# Patient Record
Sex: Female | Born: 1935 | Race: White | Hispanic: No | Smoking: Never smoker
Health system: Southern US, Community
[De-identification: ages and names within clinical notes are randomized; demographics above are authoritative.]

## PROBLEM LIST (undated history)

## (undated) DIAGNOSIS — I10 Essential (primary) hypertension: Secondary | ICD-10-CM

## (undated) DIAGNOSIS — F329 Major depressive disorder, single episode, unspecified: Secondary | ICD-10-CM

## (undated) DIAGNOSIS — F419 Anxiety disorder, unspecified: Secondary | ICD-10-CM

## (undated) DIAGNOSIS — F32A Depression, unspecified: Secondary | ICD-10-CM

## (undated) DIAGNOSIS — G309 Alzheimer's disease, unspecified: Secondary | ICD-10-CM

## (undated) DIAGNOSIS — F0280 Dementia in other diseases classified elsewhere without behavioral disturbance: Secondary | ICD-10-CM

## (undated) DIAGNOSIS — K219 Gastro-esophageal reflux disease without esophagitis: Secondary | ICD-10-CM

---

## 2004-07-11 ENCOUNTER — Ambulatory Visit: Payer: Self-pay | Admitting: Internal Medicine

## 2005-07-17 ENCOUNTER — Ambulatory Visit: Payer: Self-pay | Admitting: Internal Medicine

## 2005-12-01 ENCOUNTER — Ambulatory Visit: Payer: Self-pay | Admitting: Internal Medicine

## 2006-05-07 ENCOUNTER — Emergency Department: Payer: Self-pay | Admitting: Unknown Physician Specialty

## 2006-05-07 ENCOUNTER — Other Ambulatory Visit: Payer: Self-pay

## 2006-05-13 ENCOUNTER — Emergency Department: Payer: Self-pay

## 2006-07-26 ENCOUNTER — Ambulatory Visit: Payer: Self-pay | Admitting: Internal Medicine

## 2007-03-07 ENCOUNTER — Encounter: Payer: Self-pay | Admitting: Rheumatology

## 2007-03-10 ENCOUNTER — Encounter: Payer: Self-pay | Admitting: Rheumatology

## 2007-04-07 ENCOUNTER — Encounter: Payer: Self-pay | Admitting: Rheumatology

## 2007-07-29 ENCOUNTER — Ambulatory Visit: Payer: Self-pay | Admitting: Internal Medicine

## 2008-01-23 ENCOUNTER — Emergency Department: Payer: Self-pay | Admitting: Emergency Medicine

## 2008-03-03 ENCOUNTER — Ambulatory Visit: Payer: Self-pay | Admitting: Unknown Physician Specialty

## 2008-07-30 ENCOUNTER — Ambulatory Visit: Payer: Self-pay | Admitting: Internal Medicine

## 2011-09-21 ENCOUNTER — Ambulatory Visit: Payer: Self-pay | Admitting: Family Medicine

## 2011-10-04 ENCOUNTER — Ambulatory Visit: Payer: Self-pay | Admitting: Internal Medicine

## 2011-10-21 ENCOUNTER — Ambulatory Visit: Payer: Self-pay | Admitting: Medical

## 2012-06-06 ENCOUNTER — Ambulatory Visit: Payer: Self-pay | Admitting: Internal Medicine

## 2012-06-09 ENCOUNTER — Observation Stay: Payer: Self-pay | Admitting: Internal Medicine

## 2012-06-09 LAB — TROPONIN I: Troponin-I: <0.02 ng/mL

## 2012-06-09 LAB — COMPREHENSIVE METABOLIC PANEL
Albumin: 3.5 g/dL (ref 3.4–5.0)
Anion Gap: 6 — ABNORMAL LOW (ref 7–16)
BUN: 9 mg/dL (ref 7–18)
Calcium, Total: 9.3 mg/dL (ref 8.5–10.1)
Chloride: 101 mmol/L (ref 98–107)
Co2: 29 mmol/L (ref 21–32)
Creatinine: 0.68 mg/dL (ref 0.60–1.30)
EGFR (African American): >60
Glucose: 96 mg/dL (ref 65–99)
Potassium: 4 mmol/L (ref 3.5–5.1)
SGPT (ALT): 16 U/L (ref 12–78)
Total Protein: 8.1 g/dL (ref 6.4–8.2)

## 2012-06-09 LAB — URINALYSIS, COMPLETE
Bilirubin,UR: NEGATIVE
Blood: NEGATIVE
Glucose,UR: NEGATIVE mg/dL (ref 0–75)
Hyaline Cast: 25
Leukocyte Esterase: NEGATIVE
Nitrite: NEGATIVE
Ph: 6 (ref 4.5–8.0)
Protein: 30
RBC,UR: 2 /HPF (ref 0–5)
Specific Gravity: 1.02 (ref 1.003–1.030)
Squamous Epithelial: 13
WBC UR: 2 /HPF (ref 0–5)

## 2012-06-09 LAB — VALPROIC ACID LEVEL: Valproic Acid: 46 ug/mL — ABNORMAL LOW

## 2012-06-09 LAB — CBC
HCT: 46.6 % (ref 35.0–47.0)
MCV: 91 fL (ref 80–100)
Platelet: 209 10*3/uL (ref 150–440)
RBC: 5.1 10*6/uL (ref 3.80–5.20)

## 2012-06-10 LAB — CBC WITH DIFFERENTIAL/PLATELET
Basophil #: 0 10*3/uL (ref 0.0–0.1)
Eosinophil #: 0.2 10*3/uL (ref 0.0–0.7)
Eosinophil %: 3.5 %
HCT: 42.4 % (ref 35.0–47.0)
HGB: 14.3 g/dL (ref 12.0–16.0)
MCH: 31.1 pg (ref 26.0–34.0)
MCV: 92 fL (ref 80–100)
Monocyte #: 0.5 x10 3/mm (ref 0.2–0.9)
Monocyte %: 7.7 %
Neutrophil #: 4.4 10*3/uL (ref 1.4–6.5)
Neutrophil %: 63 %
Platelet: 195 10*3/uL (ref 150–440)
RBC: 4.59 10*6/uL (ref 3.80–5.20)
RDW: 12.8 % (ref 11.5–14.5)
WBC: 7 10*3/uL (ref 3.6–11.0)

## 2012-06-10 LAB — BASIC METABOLIC PANEL
Anion Gap: 5 — ABNORMAL LOW (ref 7–16)
Calcium, Total: 8.5 mg/dL (ref 8.5–10.1)
Chloride: 103 mmol/L (ref 98–107)
Co2: 28 mmol/L (ref 21–32)
Creatinine: 0.62 mg/dL (ref 0.60–1.30)
EGFR (African American): >60
EGFR (Non-African Amer.): >60
Glucose: 82 mg/dL (ref 65–99)
Osmolality: 270 (ref 275–301)

## 2012-07-07 ENCOUNTER — Ambulatory Visit: Payer: Self-pay | Admitting: Internal Medicine

## 2012-07-13 ENCOUNTER — Emergency Department: Payer: Self-pay | Admitting: Emergency Medicine

## 2012-07-13 LAB — URINALYSIS, COMPLETE
Bacteria: NONE SEEN
Bilirubin,UR: NEGATIVE
Blood: NEGATIVE
Ketone: NEGATIVE
Nitrite: NEGATIVE
Ph: 7 (ref 4.5–8.0)
Specific Gravity: 1.008 (ref 1.003–1.030)
WBC UR: <1 /HPF (ref 0–5)

## 2013-02-03 ENCOUNTER — Emergency Department: Payer: Self-pay | Admitting: Emergency Medicine

## 2013-02-03 LAB — COMPREHENSIVE METABOLIC PANEL
Anion Gap: 5 — ABNORMAL LOW (ref 7–16)
BUN: 15 mg/dL (ref 7–18)
Bilirubin,Total: 0.4 mg/dL (ref 0.2–1.0)
Calcium, Total: 9.1 mg/dL (ref 8.5–10.1)
Co2: 30 mmol/L (ref 21–32)
Creatinine: 0.7 mg/dL (ref 0.60–1.30)
EGFR (African American): >60
Potassium: 3.8 mmol/L (ref 3.5–5.1)
SGOT(AST): 22 U/L (ref 15–37)
SGPT (ALT): 14 U/L (ref 12–78)
Total Protein: 6.9 g/dL (ref 6.4–8.2)

## 2013-02-03 LAB — CBC WITH DIFFERENTIAL/PLATELET
Basophil #: 0 10*3/uL (ref 0.0–0.1)
Eosinophil #: 0.5 10*3/uL (ref 0.0–0.7)
HCT: 41 % (ref 35.0–47.0)
HGB: 13.6 g/dL (ref 12.0–16.0)
Lymphocyte #: 1.8 10*3/uL (ref 1.0–3.6)
Lymphocyte %: 20.8 %
MCHC: 33.1 g/dL (ref 32.0–36.0)
Monocyte #: 0.8 x10 3/mm (ref 0.2–0.9)
Monocyte %: 9.2 %
Neutrophil %: 64.1 %
Platelet: 141 10*3/uL — ABNORMAL LOW (ref 150–440)
RDW: 12.9 % (ref 11.5–14.5)
WBC: 8.9 10*3/uL (ref 3.6–11.0)

## 2013-02-03 LAB — TROPONIN I: Troponin-I: <0.02 ng/mL

## 2013-02-03 LAB — URINALYSIS, COMPLETE
Glucose,UR: NEGATIVE mg/dL (ref 0–75)
Leukocyte Esterase: NEGATIVE
Nitrite: NEGATIVE
Ph: 6 (ref 4.5–8.0)
RBC,UR: 8 /HPF (ref 0–5)
Specific Gravity: 1.018 (ref 1.003–1.030)
Squamous Epithelial: 1
WBC UR: 1 /HPF (ref 0–5)

## 2013-08-18 ENCOUNTER — Emergency Department: Payer: Self-pay | Admitting: Emergency Medicine

## 2013-10-20 ENCOUNTER — Emergency Department: Payer: Self-pay | Admitting: Emergency Medicine

## 2013-10-21 IMAGING — CT CT HEAD WITHOUT CONTRAST
1 series · 16 of 30 positions shown, 20 images · non-contrast
Comparison: none

REASON FOR EXAM: unwitnessed fall; pt found on floor
COMMENTS:

PROCEDURE:     CT  - CT HEAD WITHOUT CONTRAST  - July 13, 2012  [DATE]
RESULT:     Comparison:  None
TECHNIQUE: Multiple axial images from the foramen magnum to the vertex were
obtained without IV contrast.

[Series 2: soft tissue · axial · 0.42mm/px · z∈[+420,+556]mm · 16 of 30 slices shown, 20 images]
[im 2/30  brain]
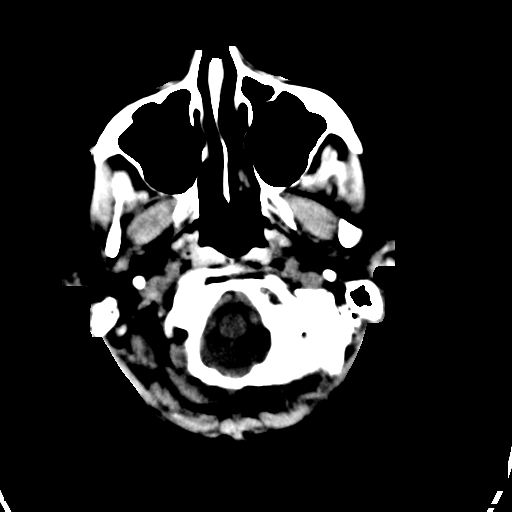
[im 2/30  bone]
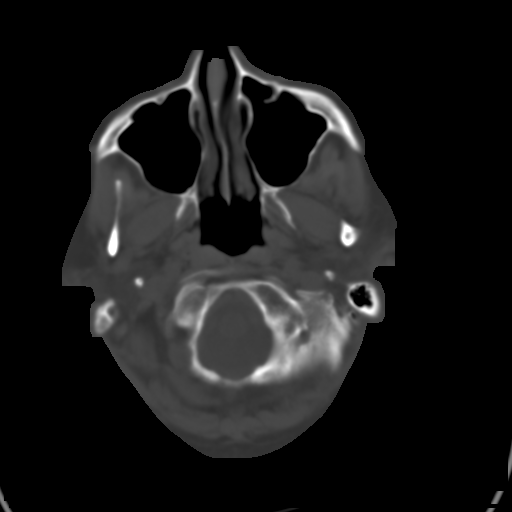
[im 4/30  brain]
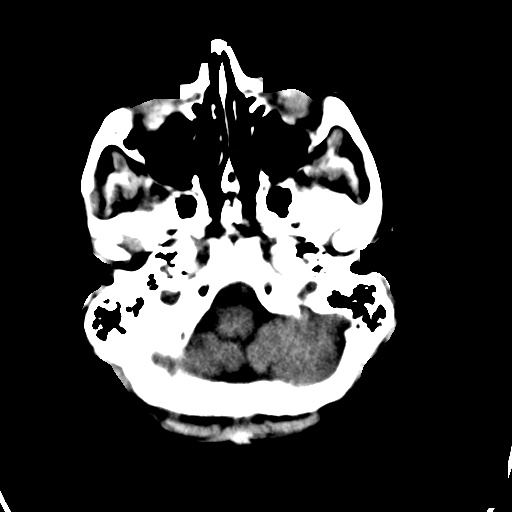
[im 6/30  brain]
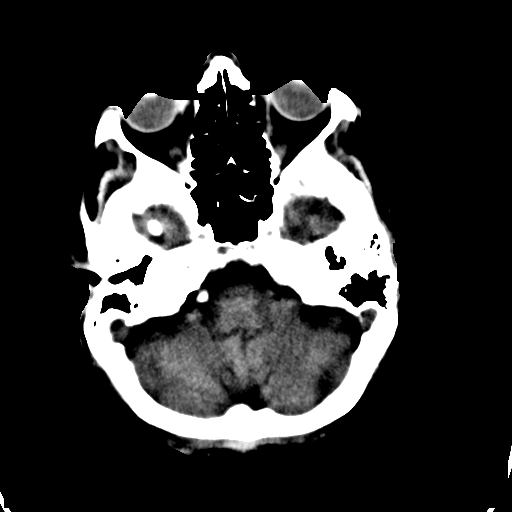
[im 8/30  brain]
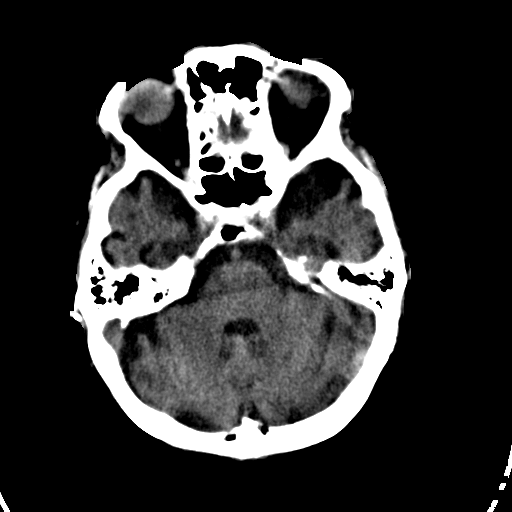
[im 9/30  brain]
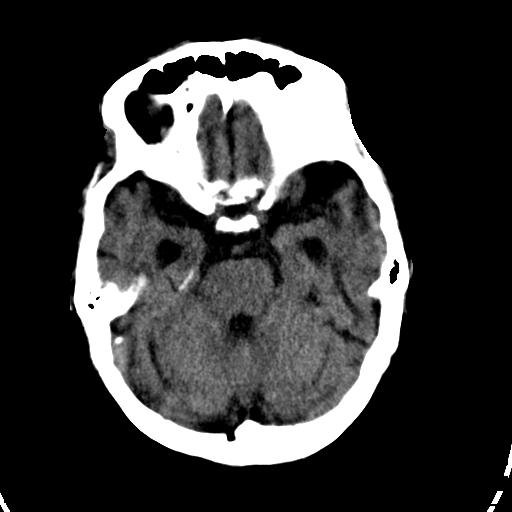
[im 9/30  bone]
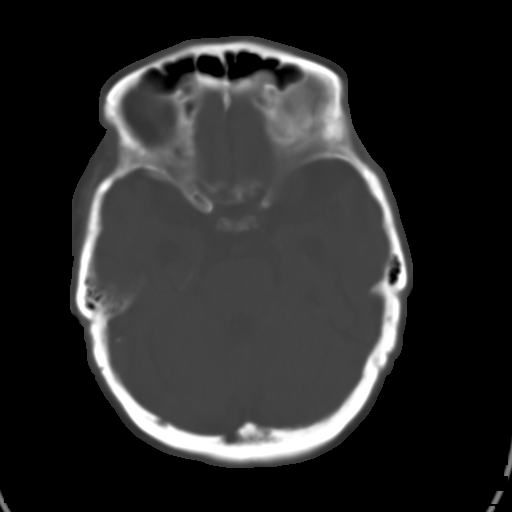
[im 11/30  brain]
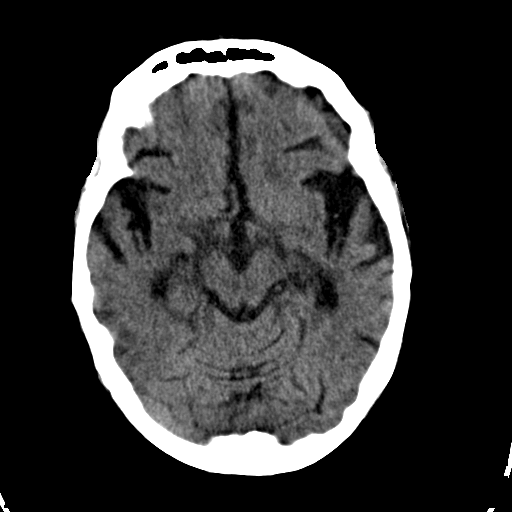
[im 13/30  brain]
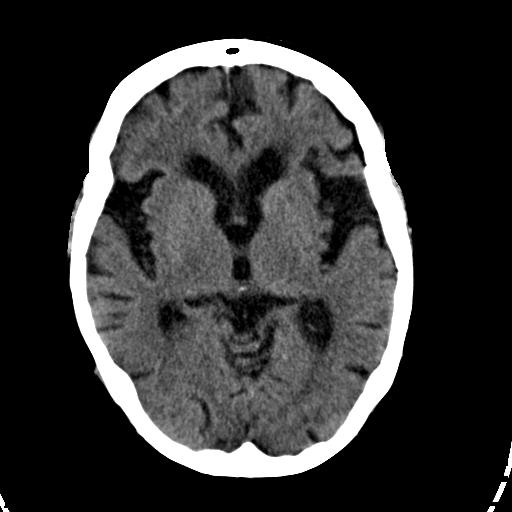
[im 15/30  brain]
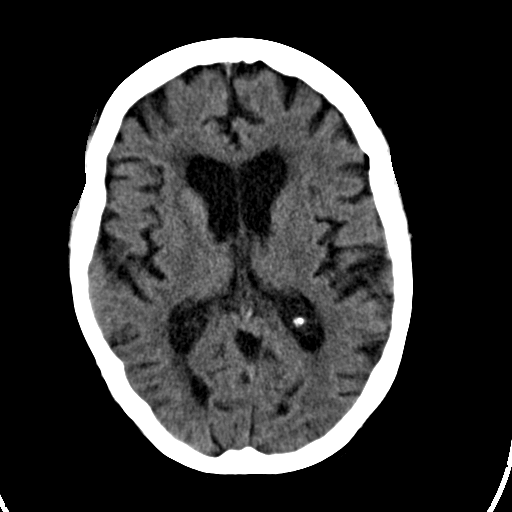
[im 16/30  brain]
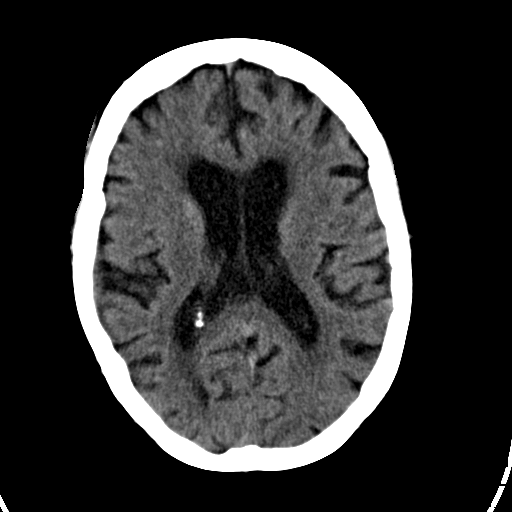
[im 16/30  bone]
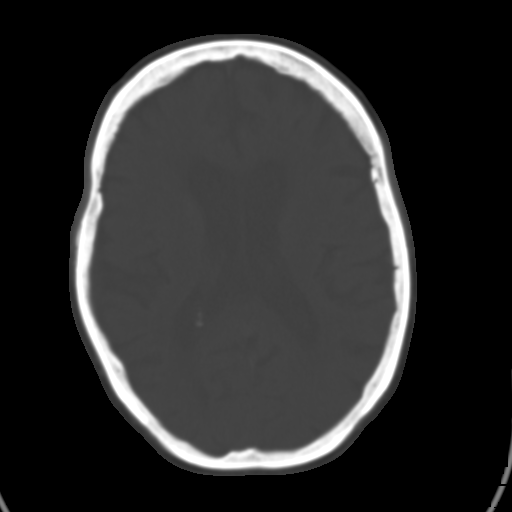
[im 18/30  brain]
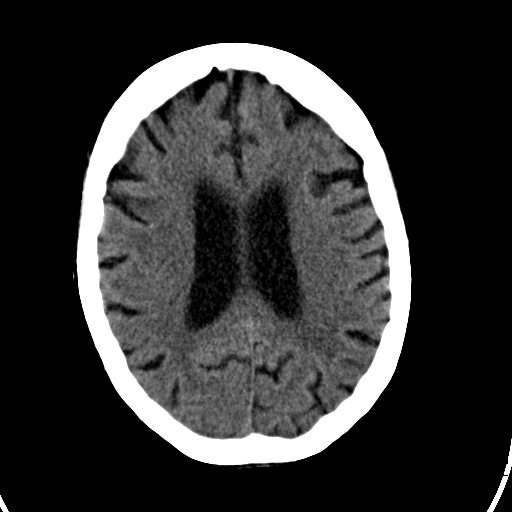
[im 20/30  brain]
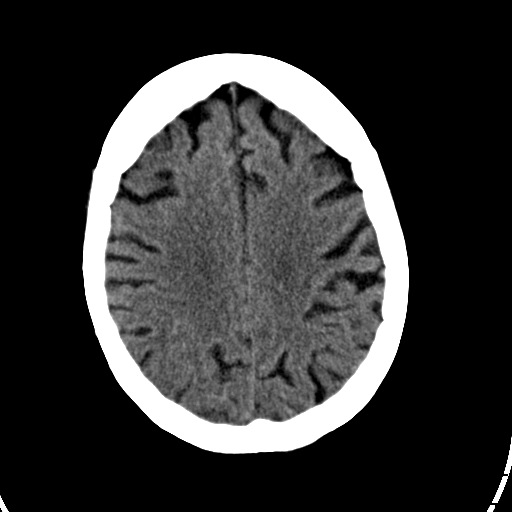
[im 22/30  brain]
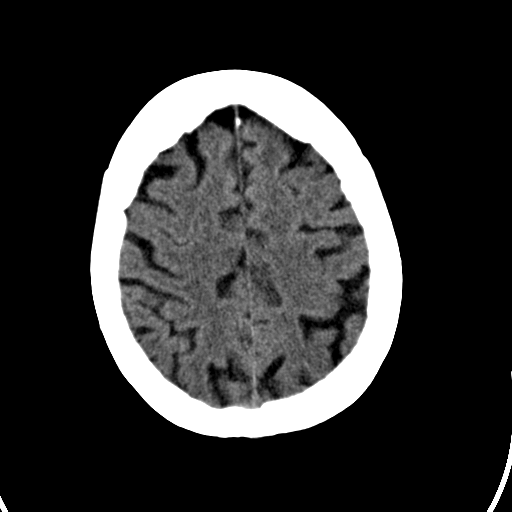
[im 23/30  brain]
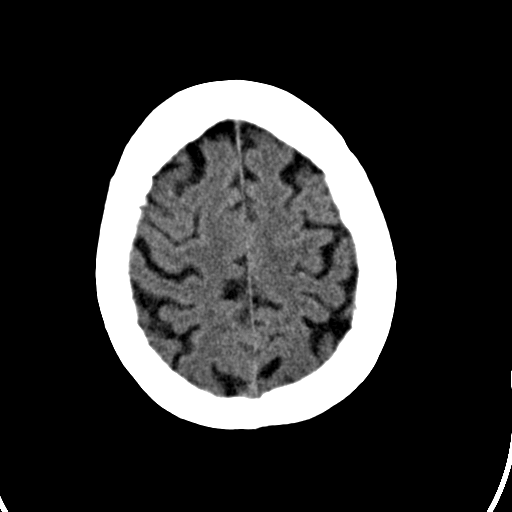
[im 23/30  bone]
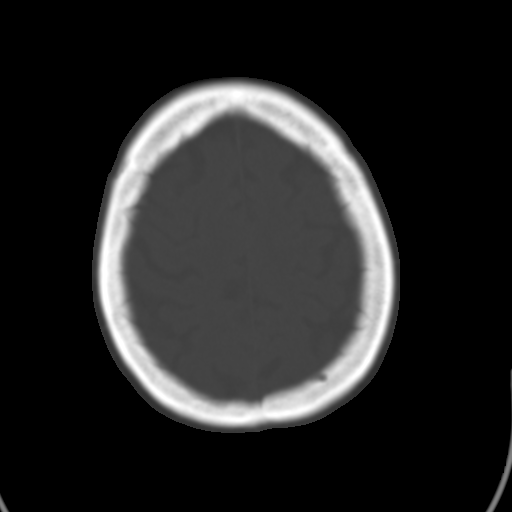
[im 25/30  brain]
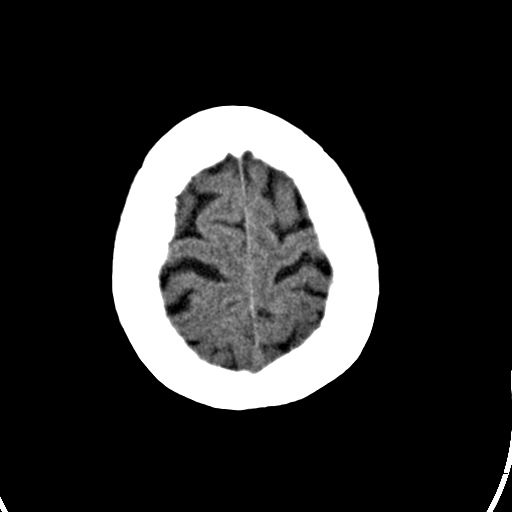
[im 27/30  brain]
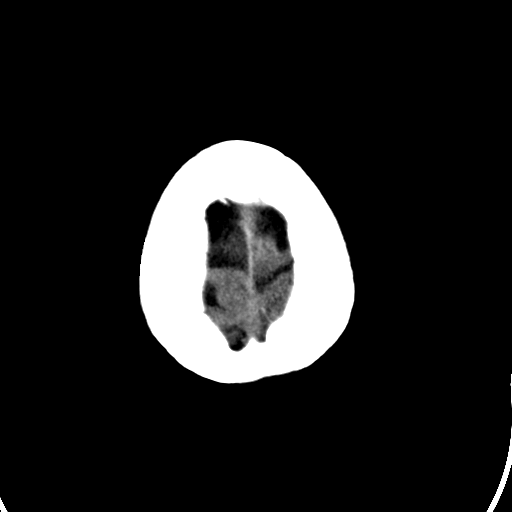
[im 29/30  brain]
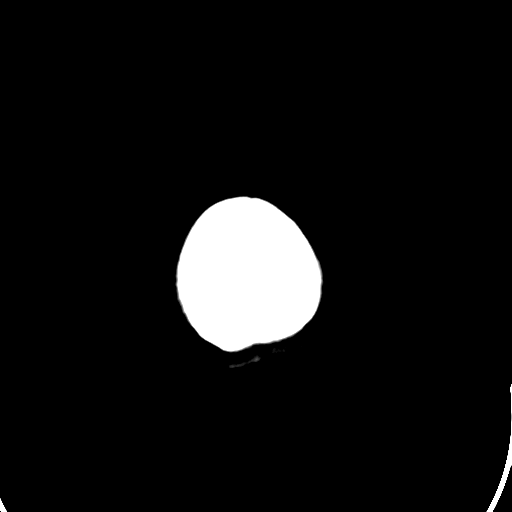

[16 of 30 positions shown; findings below may reference images not displayed]

FINDINGS: There is no evidence of mass effect, midline shift, or extra-axial fluid
collections.  There is no evidence of a space-occupying lesion or
intracranial hemorrhage. There is no evidence of a cortical-based area of
acute infarction. There is generalized cerebral atrophy. There is
periventricular white matter low attenuation likely secondary to
microangiopathy.

The ventricles and sulci are appropriate for the patient's age. The basal
cisterns are patent.

Visualized portions of the orbits are unremarkable. The visualized portions
of the paranasal sinuses and mastoid air cells are unremarkable.

The osseous structures are unremarkable.
IMPRESSION: No acute intracranial process.

[REDACTED]

## 2013-10-21 IMAGING — CT CT CERVICAL SPINE WITHOUT CONTRAST
1 series · 9 of 14 positions shown, 12 images · non-contrast
Comparison: None

REASON FOR EXAM: unwitnessed fall; pt found on floor of [REDACTED]
COMMENTS:

PROCEDURE:     CT  - CT CERVICAL SPINE WO  - July 13, 2012  [DATE]
RESULT:     Clinical Indication: Trauma
TECHNIQUE: Multiple axial CT images from the skull base to the mid vertebral
body of T1. obtained with sagittal and coronal reformatted images provided.

[Series 4: axial · axial · 0.20mm/px · z∈[+264,+413]mm · 9 of 99 slices shown, 12 images]
[im 8/99  soft-tissue]
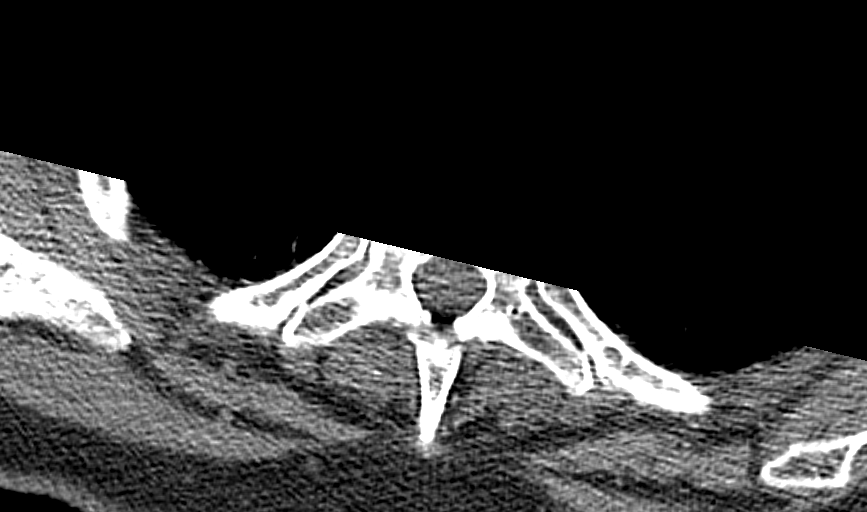
[im 8/99  bone]
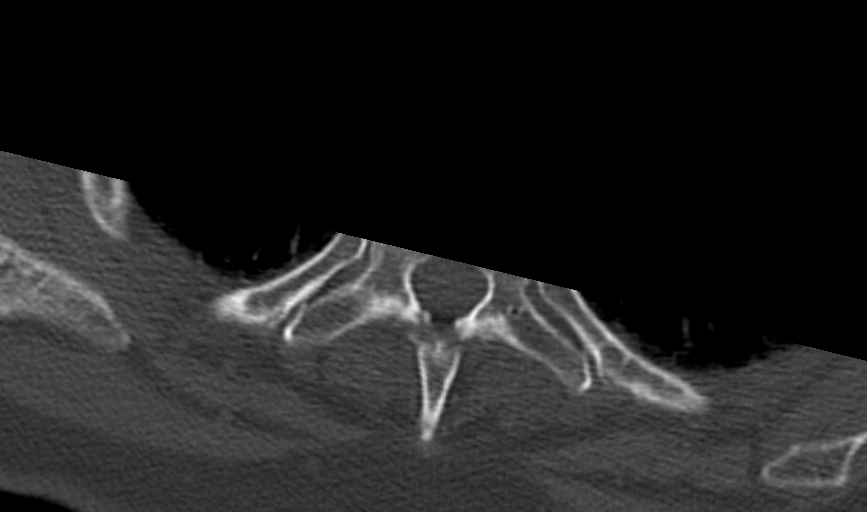
[im 23/99  bone]
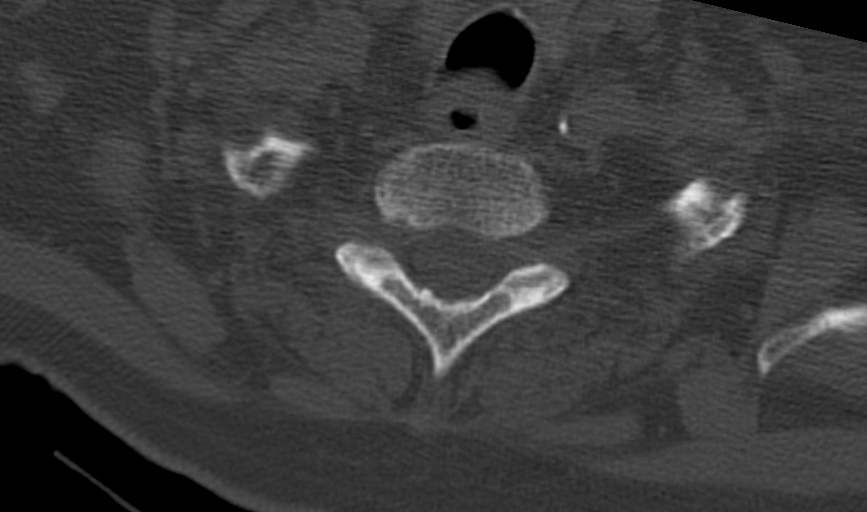
[im 31/99  bone]
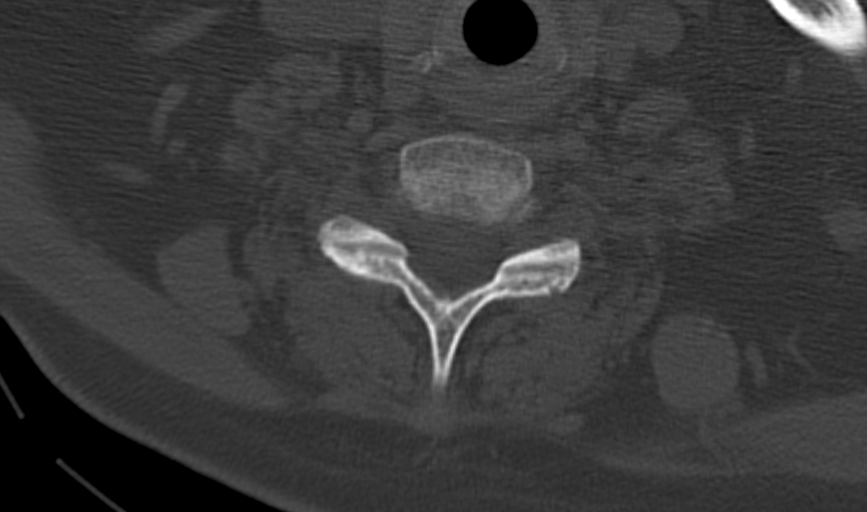
[im 38/99  bone]
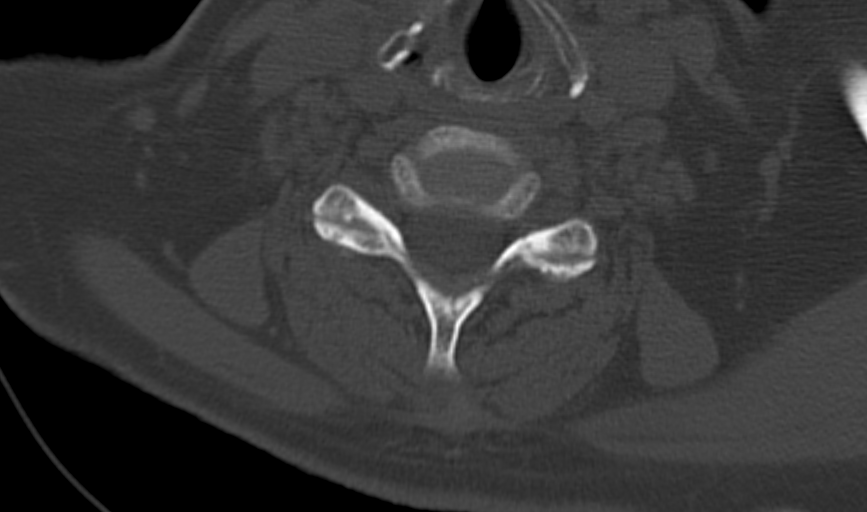
[im 53/99  soft-tissue]
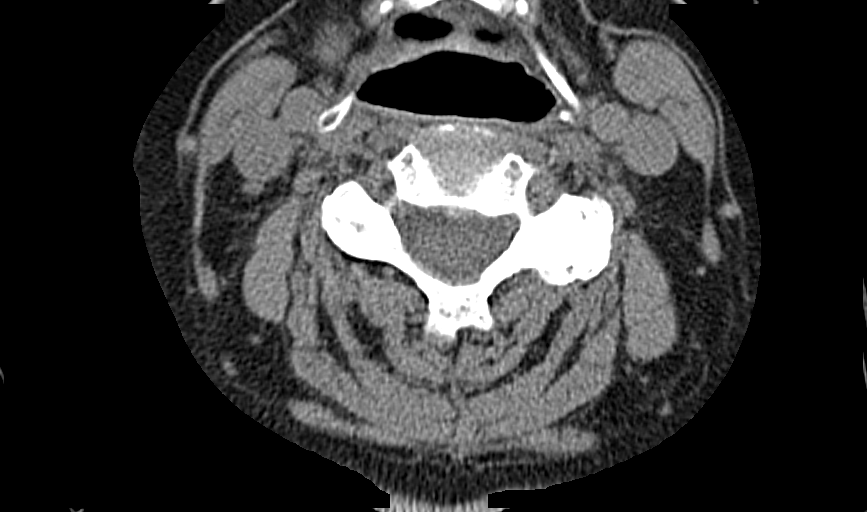
[im 53/99  bone]
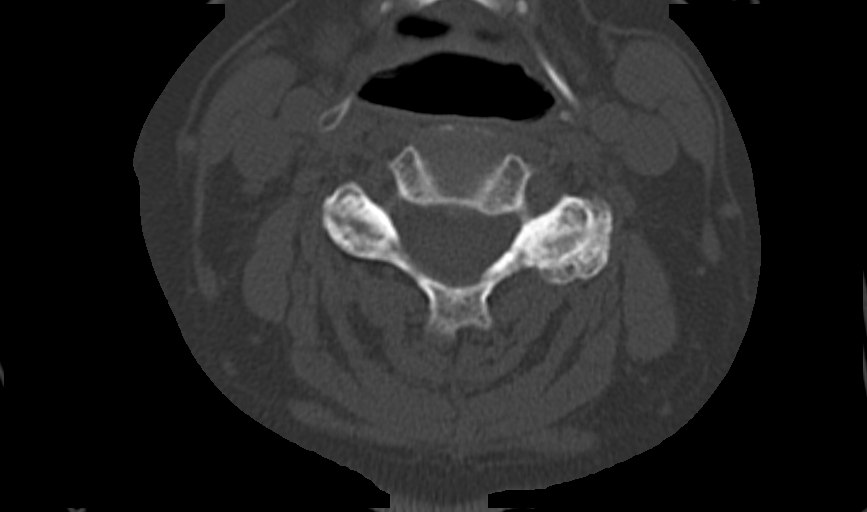
[im 61/99  bone]
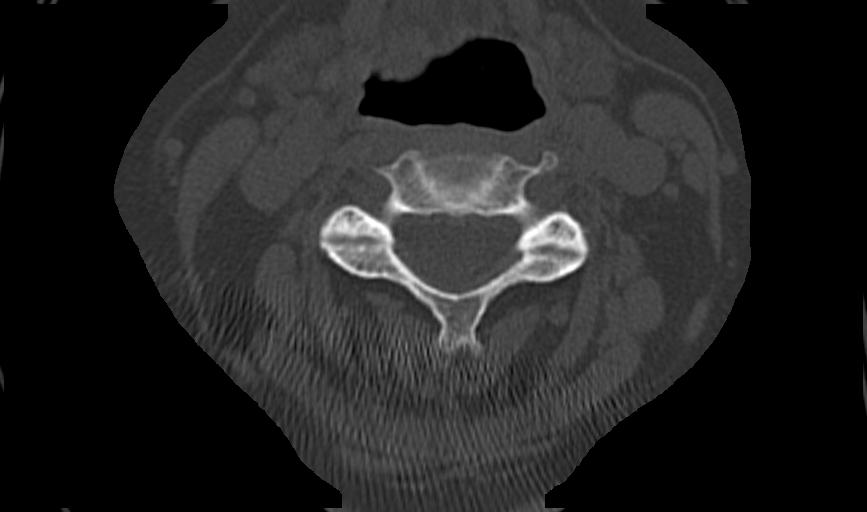
[im 68/99  bone]
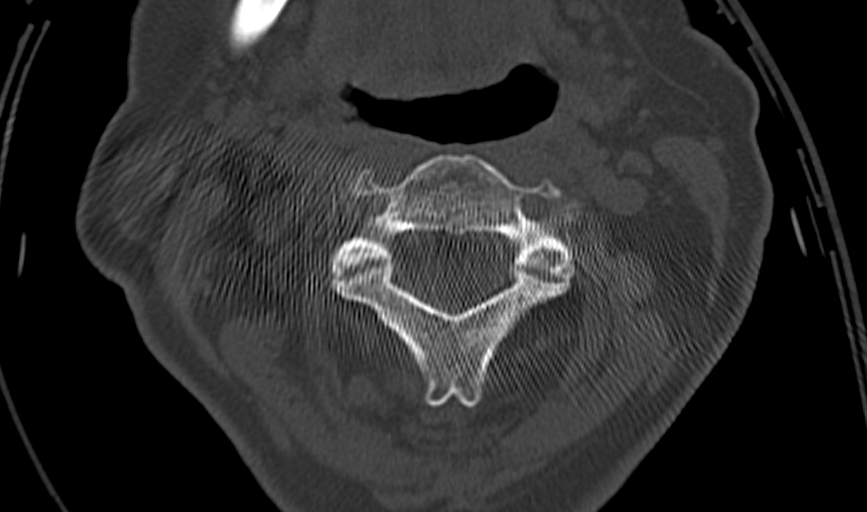
[im 83/99  bone]
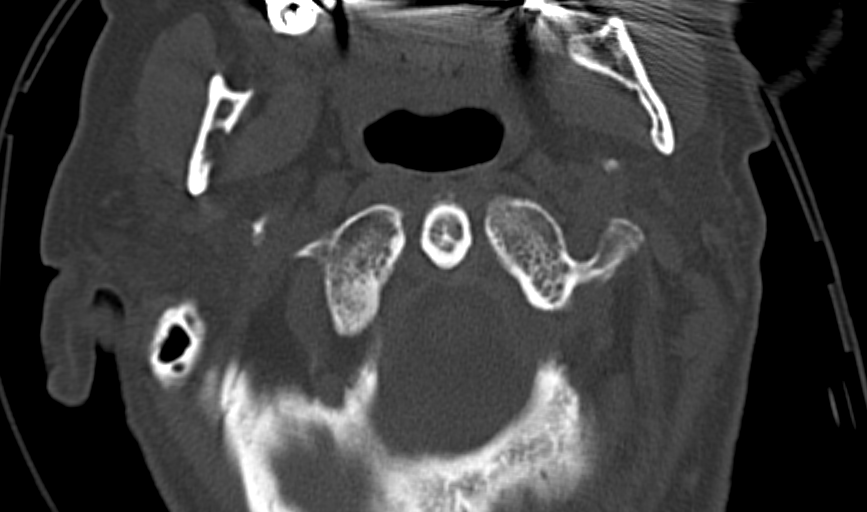
[im 91/99  soft-tissue]
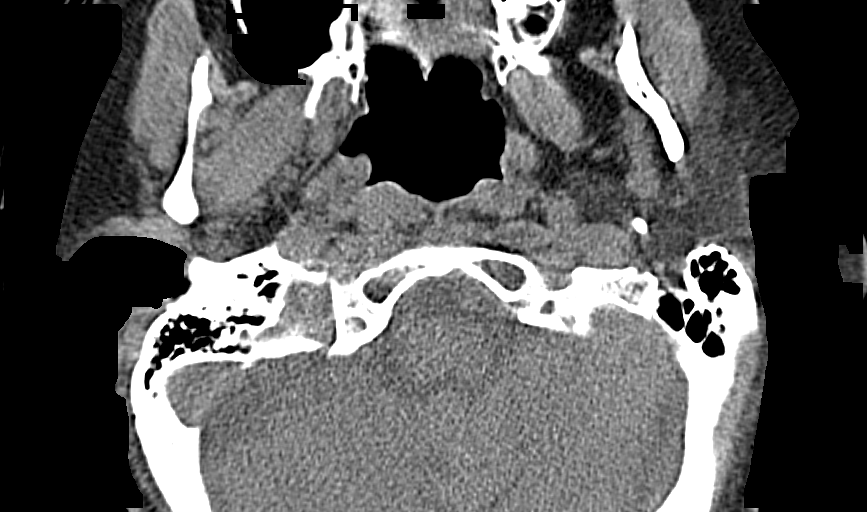
[im 91/99  bone]
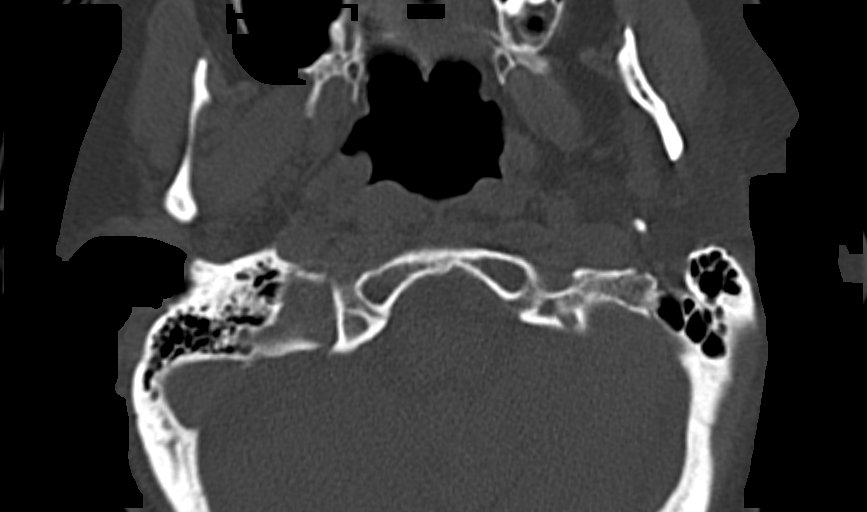

[9 of 14 positions shown; findings below may reference images not displayed]

FINDINGS: The alignment is anatomic. The vertebral body heights are maintained. There
is no acute fracture or static listhesis. The prevertebral soft tissues are
normal. The intraspinal soft tissues are not fully imaged on this
examination due to poor soft tissue contrast, but there is no soft tissue
gross abnormality.

There is diffuse left cervical facet arthropathy. The disc spaces are
maintained.

The visualized portions of the lung apices demonstrate no focal abnormality.
IMPRESSION: 1. No acute osseous injury of the cervical spine.

2. Ligamentous injury is not evaluated. If there is high clinical concern
for ligamentous injury, consider MRI or flexion/extension radiographs as
clinically indicated and tolerated.

[REDACTED]

## 2014-05-08 ENCOUNTER — Ambulatory Visit: Payer: Self-pay

## 2014-05-29 NOTE — Discharge Summary (Signed)
PATIENT NAME:  Angie Larsen, Angie Larsen MR#:  [id number] DATE OF BIRTH:  02-12-1933  DATE OF ADMISSION:  06/09/2012 DATE OF DISCHARGE:  06/12/2012  ADMITTING DIAGNOSIS: Altered mental status.   DISCHARGE DIAGNOSES: 1.  Altered mental status, questionable and suspected delirium due to infection.  2.  Suspected bacterial versus aspiration pneumonia.  3.  Dysphagia.  4.  Parkinsonism syndrome.  5.  Dementia.  6.  History of hypertension, hyperlipidemia, depression and gastroesophageal reflux disease, status post skin cancer removal as well as hysterectomy.   DISCHARGE CONDITION: Guarded.   DISCHARGE MEDICATIONS: The patient is to resume her outpatient medications which are:   1.  Divalproex sodium 250 mg p.o. twice daily.  2.  Lorazepam 0.5 mg p.o. twice daily.  3.  Ranitidine 150 mg p.o. twice daily.  4.  Paroxetine 40 mg p.o. daily.  5.  Crestor 10 mg once daily.  6.  Metoprolol tartrate 100 mg p.o. once daily at bedtime.  7.  Haloperidol 0.5 mg twice daily as needed.  8.  Docusate sodium 100 mg p.o. once daily.  9.  Levofloxacin 250 mg p.o. once daily for 4 more days.  10.  Calcium with vitamin D, 500/200, 1 tablet twice a day.   HOME OXYGEN: None.   Two-gram salt, low-fat, low-cholesterol, mechanical soft with thin liquids and ground meats, with gravy added to moisten. Strict aspiration precautions. Medications should be given in puree or crushed.   Patient should have meats ground with gravy added, send all foods in small cups or bowls for patient to hold and self-feed. Assist with feeding support if patient is unable to feed herself due to tremors.   Yogurt at breakfast, and ice cream.   ACTIVITY LIMITATIONS: As tolerated.   Physical therapy, occupational therapy speech therapy 2 to 7 times a week.   Followup appointment with Dr. Quillian Quince in 2 days after discharge.   CONSULTANTS: Pain management: Dr. Malvin Johns, neurology.  RADIOLOGIC STUDIES: Chest, portable single-view, 4th of May  2014 revealed a density at the left lung base consistent with atelectasis and pneumonia. Followup here in [place] would be useful if the patient can tolerate this procedure.   CT scan of head without contrast, 4th of May 2014 showed no acute abnormality. No acute bony abnormality as well.  Chest x-ray, PA and lateral, 6th of May 2014 showed mild left basilar opacity similar to recent prior. This could be secondary to atelectasis or infection. PA and lateral radiographs were recommended to ensure resolution.   HOSPITAL COURSE: The patient is a 81 year old Caucasian female with a history of dementia as well as hypertension, hyperlipidemia who presented to the hospital as the patient's family was not able to wake her up. Apparently over the past few days prior to coming to the hospital the patient was more somnolent and unable to recognize the family, and then the patient's husband felt that he was not able to take care of her at home due to medical issues. The patient was not eating, and she fell and he was not able to get her up, and so he brought her to the hospital. On arrival to the hospital to the Emergency Room the patient's vitals: Her temperature was 96.7, pulse was 76, respiratory rate was 18, blood pressure 129/66, saturation was 93% on room air.   Physical exam was unremarkable except the patient did have a 3/6 systolic ejection murmur and trace edema in the lower extremities. Otherwise, no abnormalities were found. On lung exam she was  not taking deep breaths, so it was difficult to examine overall.    The patient's lab data on the day of admission, 4th of May 2014: Normal BMP, normal liver enzyme test. Troponin was less than 0.02. TSH was normal at 3.37. Valproic acid level was somewhat low at 46. Patient's white blood cell count was normal at 8.2, hemoglobin was 16.0, platelet count 209.   Urinalysis revealed yellow, hazy, negative for glucose, bilirubin; 1+ ketones were noted. Specific  gravity was 1.020, PH was 6.0, negative for blood, 30 mg/dL protein, negative for nitrites or leukocyte esterase, 2 red blood cells, 2 white blood cells, no bacteria. Fifteen epithelial cells were noted as well. Mucus was present, and 25 hyaline casts.   EKG showed normal sinus rhythm at 64 beats per minute, inferior infarct, age indeterminate, as well as possible anterior infarct, age indeterminate. As compared to prior EKG done in September 2013 the patient was noted to have some changes of anterior infarct which are new.   The patient was admitted to the hospital for further evaluation as her chest x-ray was concerning for pneumonia. She was initiated on IV fluids as well as antibiotics. With IV fluid support as well as antibiotic therapy, the patient improved. She was more alert and awake, while her communication was limited due to her severe dementia. Consultation with Dr. Malvin Johns was obtained.   In regards to parkinsonian symptoms he felt that patient has advanced dementia. He also felt that the patient did not have any waxing or waning mental status changes, though he felt that a longer period of time of observation may be necessary to be certain about that. He was not aware about  her baseline, so he was not sure how far away this was from her baseline. On exam she had numerous signs of parkinsonism which can be seen in patients with advanced stages of dementia. It could be also related to primary Parkinson's disease with later onset of memory loss. As the patient was unable to provide historical distinctions due to memory loss Dr. Malvin Johns recommended to check the patient's hematocrits, B12, as well as TSH, however, did not recommend any additional neurologic workup or testing. He felt that there was no need for EMG studies. If from the history, according to Dr. Malvin Johns it appears that the patient's parkinsonism symptoms started before the memory loss, then a trial of Sinemet 25/100 3 times daily could be  initiated, according to him, for 2 days to see if the patient would improve with motor function. However he stated that we should not expect improvement of her memory. Overall he felt that the patient's presentation was more consistent with advanced progressive memory loss such as Alzheimer's or vascular dementia. He recommended palliative care, hospice consultation as well. Palliative care was consulted, and the patient was seen by Mr. Laurette Schimke, nurse practitioner, who discussed the patient's care with family. Palliative care felt that the patient does not qualify for hospice at this time as FAST was not 7c before she qualifies due to dementia criteria. He discussed with family, and the patient's family decided about patient being DO NOT RESUSCITATE. Patient was also seen by Dr. Harvie Junior, who discussed the patient's case with Mr. Windy Fast. She agreed to the assessment and plan and recommended placement.   Social workers were involved for placement matters, as well as physical therapy. The patient was deemed to be appropriate for skilled nursing facility placement, and she is going to be discharged today on the 7th  of May 2014.   On the day of discharge the patient's vitals: Temperature 97.5, pulse 66, respiratory rate was  19, blood pressure 135/62, saturation was 96% on room air at rest.   Time spent: Forty minutes.   ____________________________ Katharina Caperima Burleigh Brockmann, MD rv:dm D: 06/12/2012 11:47:17 ET T: 06/12/2012 12:25:52 ET JOB#: [id number]  cc: Katharina Caperima Margarethe Virgen, MD, <Dictator> Burley SaverL. Katherine Bliss, MD Crystal Scarberry MD ELECTRONICALLY SIGNED 06/25/2012 7:12

## 2014-05-29 NOTE — Consult Note (Signed)
Brief Consult Note: Diagnosis: Encephalopathy.   Patient was seen by consultant.   Discussed with Attending MD.   Comments: Patient is noted from have a moderate to severe dementia.  She is only oriented to person.  No waxing and waning to her mental status during my exam, though longer time period of observation would be needed to be certain about this.  I am not aware of her baseline so I am unsure how far she is from her baseline.  On exam she has numerous signs of parkinsonism which could be seen in patients with advanced stages of dementia or could be related to primary parkinsons disease with later onset of memory loss.  Patient is unable to provide these historical distinctions due to her memory loss.  Beyond HCT, B12 and TSH, I would not recommend additional neurologic workup or testing.  No need for EMG.  Her issue at present doe not appear to have an episodic component to suggest epilepsy/seizures.  If the history shows that parkinsonism started before the memory loss, then a trial of sinemet 25/100 tid could be initiated for 2 days to see if this improves her motor function.  However, this would not be expected to improve her memory.  Would rec continued medical surveillance to try to identify any other medical causes for her presumably worsened mental status.  At present, her states appears most consistent with advanced progressive memory loss as is most often associated with Alzheimer's vs vascular dementia.  Electronic Signatures: Morene CrockerPotter, Enedelia Martorelli E (MD)  (Signed 06-May-14 14:09)  Authored: Brief Consult Note   Last Updated: 06-May-14 14:09 by Morene CrockerPotter, Janit Cutter E (MD)

## 2014-05-29 NOTE — H&P (Signed)
PATIENT NAME:  Angie Larsen, Angie Larsen MR#:  [id number] DATE OF BIRTH:  1933-05-30  DATE OF ADMISSION:  06/09/2012  PRIMARY CARE PHYSICIAN: Burley Saver, MD   CHIEF COMPLAINT: Brought in by family because they could not wake her up this morning.   HISTORY OF PRESENT ILLNESS: This is a 81 year old female with dementia. She is not the best historian. The family could not get her awake today.  Now she has woken up and does not recognize her family. Most of the time she does recognize family.   Her husband is unable to take care of her at home secondary to his medical issues. She did not eat food last night, and she had 2 falls yesterday. The patient is unable to provide any history or review of systems. History was obtained by the family at the bedside.   PAST MEDICAL HISTORY: Dementia, hypertension, hyperlipidemia, depression, gastroesophageal reflux disease.   PAST SURGICAL HISTORY: Skin cancer and hysterectomy.   ALLERGIES: No known drug allergies.   MEDICATIONS: Include calcium and vitamin D 1 tablet daily, Crestor 20 mg 1/2 tablet daily, Depakote 250 mg twice a day, Colace 100 mg daily, Haldol 0.5 mg twice a day as needed for agitation, lorazepam 0.5 mg twice a day, metoprolol tartrate 100 mg at bedtime, Paxil 40 mg daily, ranitidine 150 mg twice a day.   SOCIAL HISTORY: She lives with husband. No smoking. No alcohol. No drug use. She used to work as a Tree surgeon.   FAMILY HISTORY: Father had heart trouble and CVA. Mother had congestive heart failure.   REVIEW OF SYSTEMS: Unable to obtain secondary to dementia.   PHYSICAL EXAMINATION: VITAL SIGNS: Temperature 96.7, pulse 66, respirations 18, blood pressure 129/66, pulse oximetry 93% on room air.  GENERAL: No respiratory distress.  HEENT: Eyes: Conjunctivae and lids normal. Pupils are equal, round, and reactive to light. Extraocular muscles are intact. No nystagmus. Ears, nose, mouth, and throat: Tympanic membranes no erythema. Nasal mucosa  no erythema. Throat no erythema. No exudate seen. Lips and gums no lesions.  NECK: No JVD. No bruits. No lymphadenopathy. No thyromegaly. No thyroid nodules palpated.  RESPIRATORY: The patient is not taking deep breaths for me.  What I did hear was clear. No rhonchi, rales or wheeze heard.  CARDIOVASCULAR: S1, S2 normal. +3 out of 6 systolic ejection murmur. Carotid upstroke 2+ bilaterally. No bruits. Dorsalis pedis pulses 2+ bilaterally. Trace edema of the lower extremity.  ABDOMEN: Soft, nontender. No organomegaly or splenomegaly. Normoactive bowel sounds. No masses felt.  LYMPHATIC: No lymph nodes in the neck.  MUSCULOSKELETAL: No clubbing. Trace edema. No cyanosis.  SKIN: No rashes or ulcers seen.  MUSCULOSKELETAL:  On bilateral knees, slight erythema, slight warmth.  NEUROLOGICAL: Difficult to test secondary to dementia.  The patient does move extremities on her own but not to command.  PSYCHIATRIC: The patient is alert, and I cannot test orientation secondary to dementia.   LABORATORY AND RADIOLOGICAL DATA:  Chest x-ray:  Left lung base consistent with atelectasis or pneumonia.  Troponin negative. Glucose 96, BUN 9, creatinine 0.68, sodium 136, potassium 4.0, chloride 101, CO2 29, calcium 9.3. Liver function tests normal range. White blood cell count 8.2, hemoglobin and hematocrit 16.0 and hematocrit is 46.6, platelet count of 209. Urinalysis 1+ ketones, 30 mg/dL protein, otherwise negative.   ASSESSMENT AND PLAN: 1.  Encephalopathy:  They could not wake her up this morning. Now she is more awake and unable to recognize family. This could be worsening dementia. She  has had dementia for a long-term.  Could be infection-related. Chest x-ray shows atelectasis versus pneumonia. Bilateral erythema on her knees could be a cellulitis, will give empiric Levaquin and see if she improves.  The patient does not have a cough. No fever. No white count. Unclear if this is an infection. Will check a Depakote  level.  2.  Dementia: The patient is on psychiatric medications including Haldol, Depakote and p.r.n. lorazepam.  3.  Hypertension:  Continue metoprolol.  4.  Hyperlipidemia: On Crestor.  5.  Depression: On Paxil.  6.  Gastroesophageal reflux disease: On ranitidine.   CODE STATUS: The patient is a DNR.   TIME SPENT ON ADMISSION: 50 minutes.  ____________________________ Herschell Dimesichard J. Renae GlossWieting, MD rjw:[id number] D: 06/09/2012 19:53:11 ET T: 06/09/2012 20:03:47 ET JOB#: [id number]  cc: Herschell Dimesichard J. Renae GlossWieting, MD, <Dictator> Burley SaverL. Katherine Bliss, MD Salley ScarletICHARD J Valene Villa MD ELECTRONICALLY SIGNED 06/10/2012 21:33

## 2014-05-29 NOTE — Consult Note (Signed)
Referring Physician:  Loletha Grayer :   Primary Care Physician:  Reyes Ivan [address] [place], [place] [address], [phone]  Reason for Consult: Admit Date: 09-Jun-2012  Chief Complaint: somnolence  Reason for Consult: parkinsonism   History of Present Illness: History of Present Illness:   HISTORY OF PRESENT ILLNESS: year old woman with a history of advanced moderate to advanced dementia presents after patient appeared excessively somnolent.  Neurology is asked to evaluate regarding symptoms of parkinsonism.  Patient with increasing falls recently.  Lives at home with family.  At baseline she is apparently able to recognize some family members although on day of admission she could not do this which was a change.  Not on any medication for memory loss.  Patient unable to provide much history due to her memory loss, history is gathered from prior notes and nursing staff.  No family at bedside this evening.   MEDICAL HISTORY: hypertension, hyperlipidemia, depression, gastroesophageal reflux disease.  SURGICAL HISTORY: cancer and hysterectomy.  HISTORY: with unspecified heart problems and stroke.  Mother with CHF.  HISTORY: with her husband. No tobacco, alcohol or drugs.  Previously worked as a Insurance claims handler.   MEDICATIONS: Calcium and vitamin D 1 tablet daily, Crestor 20 mg 1/2 tablet daily, Depakote 250 mg twice a day, Colace 100 mg daily, Haldol 0.5 mg twice a day as needed for agitation, lorazepam 0.5 mg twice a day, metoprolol tartrate 100 mg at bedtime, Paxil 40 mg daily, ranitidine 150 mg twice a day.  No known drug allergies.   EXAM   NAD.  Normocephalic and atraumatic. exam shows normal disc size, appearance and C/D ratio without clear evidence of papilledema. and S2 sounds are within normal limits, without murmurs, gallops, or rubs.  Resting tremor noted in upper extremities, better with movement. Bulk - Normal- Increased in the upper extremities.Drift - Absent bilaterally.-  Deferred due to patient on fall precautions.    Shoulder abduction (deltoid/supraspinatus, axillary/suprascapular n, C5)   Elbow flexion (biceps brachii, musculoskeletal n, C5-6)   Elbow extension (triceps, radial n, C7)   Finger adduction (interossei, ulnar n, T1)    Hip flexion (iliopsoas, L1/L2)   Knee flexion (hamstrings, sciatic n, L5/S1)   Knee extension (quadriceps, femoral n, L3/4)   Ankle dorsiflexion (tibialis anterior, deep fibular n, L4/5)   Ankle plantarflexion (gastroc, tibial n, S1) STATUS:is oriented x 1.  Recent memory is severely diminished.  Remote memory is moderately decreased.  Attention span and concentration are moderate diminished.  Naming, repetition, comprehension and expressive speech are within normal limits.   NERVES:   CN II (normal visual acuity and visual fields)   CN III, IV, VI (extraocular muscles are intact)   CN V (facial sensation is intact bilaterally)   CN VII (facial strength is intact bilaterally)   CN VIII (hearing is intact bilaterally)   CN IX/X (palate elevates midline, normal phonation)   CN XI (shoulder shrug strength is normal and symmetric)   CN XII (tongue protrudes midline) to pain and temp bilaterally (spinothalamic tracts)to position and vibration bilaterally (dorsal columns)    Biceps   Brachioradialis    Patellar   Achilles  Unable to follow command due to dementia.  ASSESSMENT/RECS 81 year old woman with a history of advanced moderate to advanced dementia presents after patient appeared excessively somnolent.  Patient is noted from have a moderate to severe dementia.  She is only oriented to person.  No waxing and waning to her mental status during  my exam, though longer time period of observation would be needed to be certain about this.  I am not aware of her baseline so I am unsure how far she is from her baseline.  On exam she has numerous signs of parkinsonism which could be seen in patients with advanced stages of dementia or could be  related to primary parkinsons disease with later onset of memory loss.  Patient is unable to provide these historical distinctions due to her memory loss.  Beyond HCT, B12 and TSH, I would not recommend additional neurologic workup or testing.  HCT reviewed and is unremarkable.  No need for EMG.  Her issue at present does not appear to have an episodic component to suggest epilepsy/seizures.  If the history shows that parkinsonism started before the memory loss, then a trial of sinemet 25/100 tid could be initiated for 2 days to see if this improves her motor function.  However, this would not be expected to improve her memory.  Would rec continued medical surveillance to try to identify any other medical causes for her presumably worsened mental status.  Of note patient has been diagnosed with a possible pneumonia and started on antibiotics.  Given that her mental status has improved greatly over her hospitalization this seems most consistent with medical delirium on top of underlying advanced dementia.  Patients with advanced dementia have limited reserve capacity and in the setting of infection can present with what appears to be marked neurologic deterioration which seems to be the case here.  The rapid improvement in her previously somnolent state in the setting of possible pneumonia seems to support this etiology for her presentation.  At present, her states appears most consistent with advanced progressive memory loss as is most often associated with Alzheimer's vs vascular dementia.  Consider community living arrangement as this may be safer for her and provide more support for her unique needs.  Consider palliative care/hospice consultation if only for family education about end of life care and support.   Melrose Nakayama, MD     ROS:  General denies complaints   HEENT no complaints   Lungs no complaints   Cardiac no complaints   GI no complaints   GU no complaints   Musculoskeletal no complaints    Extremities no complaints   Skin no complaints   Endocrine no complaints   Past Medical/Surgical Hx:  Dementia:   Sleeping trouble, Depression:   High Cholesterol:   HTN:   Home Medications: Medication Instructions Last Modified Date/Time  divalproex sodium 250 mg oral delayed release tablet 1 tab(s) orally 2 times a day 04-May-14 17:53  LORazepam 0.5 mg oral tablet 1 tab(s) orally 2 times a day 04-May-14 17:53  ranitidine 150 mg oral tablet 1 tab(s) orally 2 times a day 04-May-14 17:53  PARoxetine 40 mg oral tablet 1 tab(s) orally once a day 04-May-14 17:53  Crestor 20 mg oral tablet 0.5 tab (77m) orally once a day. 04-May-14 17:53  Metoprolol Tartrate 100 mg oral tablet 1 tab(s) orally once a day (at bedtime) 04-May-14 17:53  haloperidol 0.5 mg oral tablet 1 tab(s) orally 2 times a day as needed  for agitation. 04-May-14 17:53  docusate sodium sodium 100 mg oral capsule 1 cap(s) orally once a day 04-May-14 17:53  Calcium 600+D 1 tab(s) orally once a day 04-May-14 17:53   [facility] Current Meds:  Sodium Chloride 0.9%, 1000 ml at 50 ml/hr  Acetaminophen * tablet, ( Tylenol (325 mg) tablet)  650 mg  Oral q4h PRN for pain or temp. greater than 100.4  - Indication: Pain/Fever  HePARin injection, 5000 unit(s), Subcutaneous, q12h  Indication: Anticoagulant, Monitor Anticoags per hospital protocol  Ondansetron injection, ( Zofran injection )  4 mg, IV push, q4h PRN for Nausea/Vomiting  Indication: Nausea/ Vomiting  Divalproex Sodium EC tablet, ( Depakote)  250 mg Oral bid/wm  - Indication: Epilepsy/ Mania/ Migraine  Instructions:  DO NOT CRUSH  Haloperidol tablet, ( Haldol)  0.5 mg Oral bid PRN for agitation  - Indication: Psychosis/ Delirium  LORazepam tablet, ( Ativan)  0.5 mg Oral bid  - Indication: Anxiety/ Seizure/ Antiemetic Adjunct/ Preop Sedation  meTOProlol succinate ER tablet, ( Toprol XL)  100 mg Oral at bedtime  - Indication: Antihypertensive/  Angina  Instructions:  - DO NOT CRUSH  PARoxetine tablet, ( Paxil)  40 mg Oral daily  - Indication: Depression/ Panic/ OCD  Rosuvastatin Tablet, ( Crestor)  10 mg Oral daily  - Indication: hyperlipidemia  Calcium Carb 579m-Vit D 200unit tablet, 1 tablet(s) Oral bid/wm  Instructions:  Contains (5061melemental Calcium) in 125075malcium Carbonate + 200m30mtamin D  Docusate Sodium capsule, ( Colace)  100 mg Oral bid  - Indication: Stool Softener  Ranitidine tablet, ( ZanTAC)  150 mg Oral bid  - Indication: Hyperacidity  Levofloxacin injection,  ( Levaquin injection )  250 mg, IV Piggyback, q24h, Infuse over 60 minute(s)  Indication: Infection, per pharmacy dosing  Allergies:  No Known Allergies:   Vital Signs: **Vital Signs.:   05-May-14 00:05  Vital Signs Type Q 4hr  Temperature Temperature (F) 98.1  Celsius 36.7  Temperature Source oral  Pulse Pulse 80  Respirations Respirations 19  Systolic BP Systolic BP 170 053astolic BP (mmHg) Diastolic BP (mmHg) 89  Mean BP 116  Pulse Ox % Pulse Ox % 94  Pulse Ox Activity Level  At rest  Oxygen Delivery Room Air/ 21 %    04:02  Vital Signs Type Routine  Temperature Temperature (F) 98.7  Celsius 37  Temperature Source AdultAxillary  Pulse Pulse 83  Respirations Respirations 20  Systolic BP Systolic BP 147 976astolic BP (mmHg) Diastolic BP (mmHg) 74  Mean BP 98  Pulse Ox % Pulse Ox % 91  Pulse Ox Activity Level  At rest  Oxygen Delivery Room Air/ 21 %    13:41  Vital Signs Type Routine  Temperature Temperature (F) 98.2  Celsius 36.7  Temperature Source axillary  Pulse Pulse 66  Respirations Respirations 20  Systolic BP Systolic BP 135 734astolic BP (mmHg) Diastolic BP (mmHg) 67  Mean BP 89  Pulse Ox % Pulse Ox % 96  Pulse Ox Activity Level  At rest  Oxygen Delivery Room Air/ 21 %    17:15  Vital Signs Type Routine  Temperature Temperature (F) 98.2  Celsius 36.7  Pulse Pulse 64  Respirations Respirations 19   Systolic BP Systolic BP 150 193astolic BP (mmHg) Diastolic BP (mmHg) 74  Mean BP 99  Pulse Ox % Pulse Ox % 93  Pulse Ox Activity Level  At rest  Oxygen Delivery Room Air/ 21 %    20:03  Vital Signs Type Routine  Temperature Temperature (F) 98.3  Celsius 36.8  Temperature Source oral  Pulse Pulse 74  Respirations Respirations 18  Systolic BP Systolic BP 137 790astolic BP (mmHg) Diastolic BP (mmHg) 66  Mean BP 89  Pulse Ox % Pulse Ox % 92  Pulse Ox Activity Level  At rest  Oxygen Delivery Room Air/ 21 %    23:50  Vital Signs Type Routine  Temperature Temperature (F) 97.9  Celsius 36.6  Temperature Source oral  Pulse Pulse 67  Respirations Respirations 18  Systolic BP Systolic BP 397  Diastolic BP (mmHg) Diastolic BP (mmHg) 60  Mean BP 85  Pulse Ox % Pulse Ox % 93  Pulse Ox Activity Level  At rest  Oxygen Delivery Room Air/ 21 %   Lab Results:  Thyroid:  05-May-14 05:18   Thyroid Stimulating Hormone 3.37 (0.45-4.50 (International Unit)  ----------------------- Pregnant patients have  different reference  ranges for TSH:  - - - - - - - - - -  Pregnant, first trimetser:  0.36 - 2.50 uIU/mL)  Hepatic:  04-May-14 14:19   Bilirubin, Total 0.6  Alkaline Phosphatase 108  SGPT (ALT) 16  SGOT (AST) 25  Total Protein, Serum 8.1  Albumin, Serum 3.5  TDMs:  04-May-14 14:19   Valproic Acid, Serum  46 (50-100 POTENTIALLY TOXIC:  > 200 mcg/mL)  Cardiology:  04-May-14 14:03   Ventricular Rate 64  Atrial Rate 64  P-R Interval 138  QRS Duration 68  QT 422  QTc 435  P Axis -8  R Axis -17  T Axis 65  ECG interpretation Normal sinus rhythm Inferior infarct , age undetermined Cannot rule out Anterior infarct , age undetermined Abnormal ECG When compared with ECG of 21-Oct-2011 15:29, Minimal criteria for Anterior infarct are now Present Inferior infarct is now Present ----------unconfirmed---------- Confirmed by OVERREAD, NOT ([id number]), editor PEARSON, BARBARA ([id number])  on 06/10/2012 2:41:49 PM  Routine Chem:  04-May-14 14:19   Glucose, Serum 96  BUN 9  Creatinine (comp) 0.68  Sodium, Serum 136  Potassium, Serum 4.0  Chloride, Serum 101  CO2, Serum 29  Calcium (Total), Serum 9.3  Anion Gap  6  Osmolality (calc) 271  eGFR (African American) >60  eGFR (Non-African American) >60 (eGFR values <57m/min/1.73 m2 may be an indication of chronic kidney disease (CKD). Calculated eGFR is useful in patients with stable renal function. The eGFR calculation will not be reliable in acutely ill patients when serum creatinine is changing rapidly. It is not useful in  patients on dialysis. The eGFR calculation may not be applicable to patients at the low and high extremes of body sizes, pregnant women, and vegetarians.)  05-May-14 05:18   Glucose, Serum 82  BUN 10  Creatinine (comp) 0.62  Sodium, Serum 136  Potassium, Serum 4.2  Chloride, Serum 103  CO2, Serum 28  Calcium (Total), Serum 8.5  Anion Gap  5  Osmolality (calc) 270  eGFR (African American) >60  eGFR (Non-African American) >60 (eGFR values <629mmin/1.73 m2 may be an indication of chronic kidney disease (CKD). Calculated eGFR is useful in patients with stable renal function. The eGFR calculation will not be reliable in acutely ill patients when serum creatinine is changing rapidly. It is not useful in  patients on dialysis. The eGFR calculation may not be applicable to patients at the low and high extremes of body sizes, pregnant women, and vegetarians.)  Result Comment POTASSIUM - Slight hemolysis, interpret results with  - caution.  Result(s) reported on 10 Jun 2012 at 07:30AM.  Cardiac:  04-May-14 14:19   Troponin I < 0.02 (0.00-0.05 0.05 ng/mL or less: NEGATIVE  Repeat testing in 3-6 hrs  if clinically indicated. >0.05 ng/mL: POTENTIAL  MYOCARDIAL INJURY. Repeat  testing in 3-6 hrs if  clinically indicated. NOTE: An increase or decrease  of 30%  or more on serial  testing suggests  a  clinically important change)  Routine UA:  04-May-14 14:39   Color (UA) Yellow  Clarity (UA) Hazy  Glucose (UA) Negative  Bilirubin (UA) Negative  Ketones (UA) 1+  Specific Gravity (UA) 1.020  Blood (UA) Negative  pH (UA) 6.0  Protein (UA) 30 mg/dL  Nitrite (UA) Negative  Leukocyte Esterase (UA) Negative (Result(s) reported on 09 Jun 2012 at 03:16PM.)  RBC (UA) 2 /HPF  WBC (UA) 2 /HPF  Bacteria (UA) NONE SEEN  Epithelial Cells (UA) 13 /HPF  Mucous (UA) PRESENT  Hyaline Cast (UA) 25 /LPF (Result(s) reported on 09 Jun 2012 at 03:16PM.)  Routine Hem:  04-May-14 14:19   WBC (CBC) 8.2  RBC (CBC) 5.10  Hemoglobin (CBC) 16.0  Hematocrit (CBC) 46.6  Platelet Count (CBC) 209 (Result(s) reported on 09 Jun 2012 at 02:39PM.)  MCV 91  MCH 31.5  MCHC 34.4  RDW 12.8  05-May-14 05:18   WBC (CBC) 7.0  RBC (CBC) 4.59  Hemoglobin (CBC) 14.3  Hematocrit (CBC) 42.4  Platelet Count (CBC) 195  MCV 92  MCH 31.1  MCHC 33.7  RDW 12.8  Neutrophil % 63.0  Lymphocyte % 25.3  Monocyte % 7.7  Eosinophil % 3.5  Basophil % 0.5  Neutrophil # 4.4  Lymphocyte # 1.8  Monocyte # 0.5  Eosinophil # 0.2  Basophil # 0.0 (Result(s) reported on 10 Jun 2012 at 07:30AM.)   Radiology Results: CT:    04-May-14 17:25, CT Head Without Contrast  CT Head Without Contrast   REASON FOR EXAM:    confusion  COMMENTS:       PROCEDURE: CT  - CT HEAD WITHOUT CONTRAST  - Jun 09 2012  5:25PM     RESULT: History: Weakness.    Comparison Study: No recent.    Findings: Standard nonenhanced CT obtained. Diffuse atrophy present. No   mass. No hydrocephalus. No hemorrhage.    IMPRESSION:  No acute abnormality. No acute bony abnormality.      Verified By: Osa Craver, M.D., MD   Electronic Signatures: Anabel Bene (MD)  (Signed 07-May-14 01:11)  Authored: REFERRING PHYSICIAN, Primary Care Physician, Consult, History of Present Illness, Review of Systems, PAST MEDICAL/SURGICAL HISTORY, HOME  MEDICATIONS, Current Medications, ALLERGIES, NURSING VITAL SIGNS, LAB RESULTS, RADIOLOGY RESULTS   Last Updated: 07-May-14 01:11 by Anabel Bene (MD)

## 2014-06-27 ENCOUNTER — Encounter: Payer: Self-pay | Admitting: Emergency Medicine

## 2014-06-27 ENCOUNTER — Emergency Department
Admission: EM | Admit: 2014-06-27 | Discharge: 2014-06-27 | Disposition: A | Discharge status: Home / Self Care | Payer: Medicare HMO | Attending: Emergency Medicine | Admitting: Emergency Medicine

## 2014-06-27 DIAGNOSIS — Y92129 Unspecified place in nursing home as the place of occurrence of the external cause: Secondary | ICD-10-CM | POA: Diagnosis not present

## 2014-06-27 DIAGNOSIS — I10 Essential (primary) hypertension: Secondary | ICD-10-CM | POA: Diagnosis not present

## 2014-06-27 DIAGNOSIS — S0990XA Unspecified injury of head, initial encounter: Secondary | ICD-10-CM | POA: Diagnosis present

## 2014-06-27 DIAGNOSIS — W01198A Fall on same level from slipping, tripping and stumbling with subsequent striking against other object, initial encounter: Secondary | ICD-10-CM | POA: Insufficient documentation

## 2014-06-27 DIAGNOSIS — S0093XA Contusion of unspecified part of head, initial encounter: Secondary | ICD-10-CM | POA: Diagnosis not present

## 2014-06-27 DIAGNOSIS — W19XXXA Unspecified fall, initial encounter: Secondary | ICD-10-CM

## 2014-06-27 DIAGNOSIS — F0280 Dementia in other diseases classified elsewhere without behavioral disturbance: Secondary | ICD-10-CM | POA: Insufficient documentation

## 2014-06-27 DIAGNOSIS — Y9389 Activity, other specified: Secondary | ICD-10-CM | POA: Diagnosis not present

## 2014-06-27 DIAGNOSIS — Y998 Other external cause status: Secondary | ICD-10-CM | POA: Diagnosis not present

## 2014-06-27 DIAGNOSIS — Z79899 Other long term (current) drug therapy: Secondary | ICD-10-CM | POA: Diagnosis not present

## 2014-06-27 DIAGNOSIS — G309 Alzheimer's disease, unspecified: Secondary | ICD-10-CM | POA: Diagnosis not present

## 2014-06-27 DIAGNOSIS — F0390 Unspecified dementia without behavioral disturbance: Secondary | ICD-10-CM

## 2014-06-27 HISTORY — DX: Gastro-esophageal reflux disease without esophagitis: K21.9

## 2014-06-27 HISTORY — DX: Depression, unspecified: F32.A

## 2014-06-27 HISTORY — DX: Major depressive disorder, single episode, unspecified: F32.9

## 2014-06-27 HISTORY — DX: Alzheimer's disease, unspecified: G30.9

## 2014-06-27 HISTORY — DX: Dementia in other diseases classified elsewhere, unspecified severity, without behavioral disturbance, psychotic disturbance, mood disturbance, and anxiety: F02.80

## 2014-06-27 HISTORY — DX: Essential (primary) hypertension: I10

## 2014-06-27 HISTORY — DX: Anxiety disorder, unspecified: F41.9

## 2014-06-27 NOTE — Discharge Instructions (Signed)
Mrs. Alona Beneackett looks well. She has a small bump on the back of her head. She is alert and communicative although confused. We don't see any neurologic deficit. She is not on blood thinners. With that in mind we do not think a CT scan was indicated today and none was performed. If she has any signs of altered mental status or pain or if you see other signs be concerned about, please return her to the emergency department immediately.  Head Injury You have received a head injury. It does not appear serious at this time. Headaches and vomiting are common following head injury. It should be easy to awaken from sleeping. Sometimes it is necessary for you to stay in the emergency department for a while for observation. Sometimes admission to the hospital may be needed. After injuries such as yours, most problems occur within the first 24 hours, but side effects may occur up to 7-10 days after the injury. It is important for you to carefully monitor your condition and contact your health care provider or seek immediate medical care if there is a change in your condition. WHAT ARE THE TYPES OF HEAD INJURIES? Head injuries can be as minor as a bump. Some head injuries can be more severe. More severe head injuries include:  A jarring injury to the brain (concussion).  A bruise of the brain (contusion). This mean there is bleeding in the brain that can cause swelling.  A cracked skull (skull fracture).  Bleeding in the brain that collects, clots, and forms a bump (hematoma). WHAT CAUSES A HEAD INJURY? A serious head injury is most likely to happen to someone who is in a car wreck and is not wearing a seat belt. Other causes of major head injuries include bicycle or motorcycle accidents, sports injuries, and falls. HOW ARE HEAD INJURIES DIAGNOSED? A complete history of the event leading to the injury and your current symptoms will be helpful in diagnosing head injuries. Many times, pictures of the brain, such as  CT or MRI are needed to see the extent of the injury. Often, an overnight hospital stay is necessary for observation.  WHEN SHOULD I SEEK IMMEDIATE MEDICAL CARE?  You should get help right away if:  You have confusion or drowsiness.  You feel sick to your stomach (nauseous) or have continued, forceful vomiting.  You have dizziness or unsteadiness that is getting worse.  You have severe, continued headaches not relieved by medicine. Only take over-the-counter or prescription medicines for pain, fever, or discomfort as directed by your health care provider.  You do not have normal function of the arms or legs or are unable to walk.  You notice changes in the black spots in the center of the colored part of your eye (pupil).  You have a clear or bloody fluid coming from your nose or ears.  You have a loss of vision. During the next 24 hours after the injury, you must stay with someone who can watch you for the warning signs. This person should contact local emergency services (911 in the [place] if you have seizures, you become unconscious, or you are unable to wake up. HOW CAN I PREVENT A HEAD INJURY IN THE FUTURE? The most important factor for preventing major head injuries is avoiding motor vehicle accidents. To minimize the potential for damage to your head, it is crucial to wear seat belts while riding in motor vehicles. Wearing helmets while bike riding and playing collision sports (like football) is also  helpful. Also, avoiding dangerous activities around the house will further help reduce your risk of head injury.  WHEN CAN I RETURN TO NORMAL ACTIVITIES AND ATHLETICS? You should be reevaluated by your health care provider before returning to these activities. If you have any of the following symptoms, you should not return to activities or contact sports until 1 week after the symptoms have stopped:  Persistent headache.  Dizziness or vertigo.  Poor attention and  concentration.  Confusion.  Memory problems.  Nausea or vomiting.  Fatigue or tire easily.  Irritability.  Intolerant of bright lights or loud noises.  Anxiety or depression.  Disturbed sleep. MAKE SURE YOU:   Understand these instructions.  Will watch your condition.  Will get help right away if you are not doing well or get worse. Document Released: 01/23/2005 Document Revised: 01/28/2013 Document Reviewed: 09/30/2012 [facility] Patient Information 2015 [place], [place]. This information is not intended to replace advice given to you by your health care provider. Make sure you discuss any questions you have with your health care provider.

## 2014-06-27 NOTE — ED Notes (Signed)
[facility] Per EMS patient struck the back of her head. Patient has small hematoma to the back of the head. No bleeding. Patient does not remember the fall. Patient hx of dementia. Patient not on any blood thinners. Per EMS patient was ambulatory to stretcher.

## 2014-06-27 NOTE — ED Provider Notes (Signed)
[facility] Emergency Department Provider Note  ____________________________________________  Time seen: 1540  I have reviewed the triage vital signs and the nursing notes.  History is limited due to dementia.   HISTORY  Chief Complaint Fall  head contusion hematoma  HPI Angie Larsen is a 81 y.o. female who [facility]. She apparently fell and hit the back of her head. She has a small hematoma present. The patient has dementia and is confused. She explains that she was at home and that she was trying to send one of her 2 or 3 nurse's away and they sent her to the emergency department to be evaluated. The patient goes on to say she wanted to go see "mama" who is still alive. She goes on to report other somewhat confused things.  The patient is alert and interactive, though confused. She does follow commands and moves all extremities well. She is interactive. She is in no acute distress.     Past Medical History  Diagnosis Date  . Anxiety   . Depression   . Alzheimer's dementia   . Hypertension   . GERD (gastroesophageal reflux disease)     There are no active problems to display for this patient.   History reviewed. No pertinent past surgical history.  Current Outpatient Rx  Name  Route  Sig  Dispense  Refill  . acetaminophen (TYLENOL) 500 MG tablet   Oral   Take 1,000 mg by mouth 3 (three) times daily.         . cetirizine (ZYRTEC) 10 MG tablet   Oral   Take 10 mg by mouth daily.         . divalproex (DEPAKOTE) 250 MG DR tablet   Oral   Take 250 mg by mouth 2 (two) times daily.         Marland Kitchen. docusate sodium (COLACE) 100 MG capsule   Oral   Take 100 mg by mouth 2 (two) times daily.         Marland Kitchen. gemfibrozil (LOPID) 600 MG tablet   Oral   Take 600 mg by mouth 2 (two) times daily before a meal. Take 30 minutes before breakfast and supper.         . levothyroxine (SYNTHROID, LEVOTHROID) 100 MCG tablet   Oral   Take 100  mcg by mouth daily before breakfast.         . metoprolol (LOPRESSOR) 50 MG tablet   Oral   Take 50 mg by mouth 2 (two) times daily.         Marland Kitchen. PARoxetine (PAXIL) 20 MG tablet   Oral   Take 20 mg by mouth daily.         . Polyethylene Glycol 3350 POWD   Oral   Take 17 g by mouth daily as needed (for constipation.).         Marland Kitchen. ranitidine (ZANTAC) 150 MG tablet   Oral   Take 150 mg by mouth 2 (two) times daily.         . rosuvastatin (CRESTOR) 20 MG tablet   Oral   Take 20 mg by mouth daily.         . Skin Protectants, Misc. (MINERIN) CREA   Apply externally   Apply 1 application topically 3 (three) times a week. After bathing on Monday, Wednesday, and Friday.         . triamcinolone cream (KENALOG) 0.1 %   Topical   Apply 1 application  topically 2 (two) times daily.           Allergies Haldol  History reviewed. No pertinent family history.  Social History History  Substance Use Topics  . Smoking status: Never Smoker   . Smokeless tobacco: Not on file  . Alcohol Use: Not on file    Review of Systems Review of systems not possible due to patient's advanced dementia and confusion.  ____________________________________________   PHYSICAL EXAM:  VITAL SIGNS: ED Triage Vitals  Enc Vitals Group     BP 06/27/14 1506 151/67 mmHg     Pulse Rate 06/27/14 1506 61     Resp 06/27/14 1506 18     Temp 06/27/14 1506 97.8 F (36.6 C)     Temp Source 06/27/14 1506 Oral     SpO2 06/27/14 1506 99 %     Weight 06/27/14 1506 160 lb (72.576 kg)     Height 06/27/14 1506  (1.651 m)     Head Cir --      Peak Flow --      Pain Score --      Pain Loc --      Pain Edu? --      Excl. in GC? --     Constitutional: Alert but confused Well appearing and in no distress. ENT   Head: Normocephalic, there is a mild hematoma in the back left portion of her scalp. This is not tender.   Nose: No congestion/rhinnorhea.   Mouth/Throat: Mucous membranes are  moist. Neck: Nontender. Normal range of motion in spontaneous movement without distress. Cardiovascular: Normal rate, regular rhythm. Respiratory: Normal respiratory effort without tachypnea. Breath sounds are clear and equal bilaterally. No wheezes/rales/rhonchi. Gastrointestinal: Soft and nontender. No distention.  Back: There is no CVA tenderness. Musculoskeletal: Nontender with normal range of motion in all extremities.  No noted edema. Neurologic:  Patient moves all 4 extremities easily and spontaneously. There is no focal neurologic deficits other than the cognitive. Patient is significantly confused and speaks about her "mama" who is still alive and does not understand what her living situation is..  Skin:  Skin is warm, dry. No rash noted. Psychiatric: Alert, interactive, confused as noted above. ____________________________________________  INITIAL IMPRESSION / ASSESSMENT AND PLAN / ED COURSE  This patient appears to be at her baseline as we understand it. We will [facility] and confirm that. She is neurologically intact except for her chronic dementia/confusion issues. I do not believe a CT scan of her head is indicated given the minimal injury to the back of her head, then normal appearance of her currently, and the fact that she is not on anticoagulants.  ____________________________________________   FINAL CLINICAL IMPRESSION(S) / ED DIAGNOSES  Final diagnoses:  Chronic dementia, without behavioral disturbance  Fall at nursing home, initial encounter  Head contusion, initial encounter      Darien Ramus, MD 06/27/14 1559

## 2015-09-04 ENCOUNTER — Emergency Department
Admission: EM | Admit: 2015-09-04 | Discharge: 2015-09-04 | Disposition: A | Discharge status: Other Acute Inpatient Hospital | Payer: Medicare HMO | Attending: Emergency Medicine | Admitting: Emergency Medicine

## 2015-09-04 ENCOUNTER — Encounter: Payer: Self-pay | Admitting: Emergency Medicine

## 2015-09-04 DIAGNOSIS — K922 Gastrointestinal hemorrhage, unspecified: Secondary | ICD-10-CM | POA: Insufficient documentation

## 2015-09-04 DIAGNOSIS — Z79899 Other long term (current) drug therapy: Secondary | ICD-10-CM | POA: Diagnosis not present

## 2015-09-04 DIAGNOSIS — K625 Hemorrhage of anus and rectum: Secondary | ICD-10-CM | POA: Diagnosis present

## 2015-09-04 DIAGNOSIS — F0281 Dementia in other diseases classified elsewhere with behavioral disturbance: Secondary | ICD-10-CM | POA: Insufficient documentation

## 2015-09-04 DIAGNOSIS — G309 Alzheimer's disease, unspecified: Secondary | ICD-10-CM | POA: Diagnosis not present

## 2015-09-04 DIAGNOSIS — F329 Major depressive disorder, single episode, unspecified: Secondary | ICD-10-CM | POA: Diagnosis not present

## 2015-09-04 DIAGNOSIS — Z791 Long term (current) use of non-steroidal anti-inflammatories (NSAID): Secondary | ICD-10-CM | POA: Diagnosis not present

## 2015-09-04 DIAGNOSIS — I10 Essential (primary) hypertension: Secondary | ICD-10-CM | POA: Insufficient documentation

## 2015-09-04 LAB — CBC
HEMATOCRIT: 39.3 % (ref 35.0–47.0)
Hemoglobin: 13.5 g/dL (ref 12.0–16.0)
MCH: 31.8 pg (ref 26.0–34.0)
MCHC: 34.4 g/dL (ref 32.0–36.0)
MCV: 92.5 fL (ref 80.0–100.0)
Platelets: 195 10*3/uL (ref 150–440)
RBC: 4.25 MIL/uL (ref 3.80–5.20)
RDW: 12.9 % (ref 11.5–14.5)
WBC: 9.7 10*3/uL (ref 3.6–11.0)

## 2015-09-04 LAB — COMPREHENSIVE METABOLIC PANEL
ALT: 9 U/L — ABNORMAL LOW (ref 14–54)
AST: 17 U/L (ref 15–41)
Albumin: 3.5 g/dL (ref 3.5–5.0)
Alkaline Phosphatase: 71 U/L (ref 38–126)
Anion gap: 5 (ref 5–15)
BILIRUBIN TOTAL: 0.4 mg/dL (ref 0.3–1.2)
BUN: 16 mg/dL (ref 6–20)
CO2: 31 mmol/L (ref 22–32)
Calcium: 9 mg/dL (ref 8.9–10.3)
Chloride: 101 mmol/L (ref 101–111)
Creatinine, Ser: 0.68 mg/dL (ref 0.44–1.00)
GFR calc Af Amer: >60 mL/min (ref >60)
GFR calc non Af Amer: >60 mL/min (ref >60)
GLUCOSE: 117 mg/dL — AB (ref 65–99)
POTASSIUM: 4 mmol/L (ref 3.5–5.1)
Sodium: 137 mmol/L (ref 135–145)
TOTAL PROTEIN: 7.1 g/dL (ref 6.5–8.1)

## 2015-09-04 LAB — TYPE AND SCREEN
ABO/RH(D): A POS
ANTIBODY SCREEN: NEGATIVE

## 2015-09-04 NOTE — ED Notes (Signed)
Hemacult performed by ED MD, RN and PA student at bedside.

## 2015-09-04 NOTE — ED Provider Notes (Signed)
[facility] Emergency Department Provider Note  ____________________________________________   First MD Initiated Contact with Patient 09/04/15 2129     (approximate)  I have reviewed the triage vital signs and the nursing notes.   HISTORY  Chief Complaint Rectal Bleeding  The history and physical are both severely limited by the patient's advanced dementia.  Her sister who is her power of attorney is at bedside.  HPI NICHEL BETHELL is a 82 y.o. female with advanced dementia who lives at a nursing facility presents by EMS for evaluation of what is described as acute onset rectal bleeding.  Details are scarce, but reportedly she was at her facility when she began passing bright red blood per rectum.  At the same time, she developed acute epistaxis.  The nose is no longer bleeding and the patient is in no acute distress.  She is verbal and is able to tell me her name and the name of her sister but otherwise she is incoherent which her sister states is her baseline.  She is not having any difficulty breathing and is denying any pain at this time.   Past Medical History:  Diagnosis Date  . Alzheimer's dementia   . Anxiety   . Depression   . GERD (gastroesophageal reflux disease)   . Hypertension     There are no active problems to display for this patient.   History reviewed. No pertinent surgical history.  Prior to Admission medications   Medication Sig Start Date End Date Taking? Authorizing Provider  acetaminophen (TYLENOL) 500 MG tablet Take 1,000 mg by mouth daily as needed for mild pain.    Yes Historical Provider, MD  divalproex (DEPAKOTE) 250 MG DR tablet Take 250 mg by mouth 2 (two) times daily.   Yes Historical Provider, MD  docusate sodium (COLACE) 100 MG capsule Take 100 mg by mouth 2 (two) times daily.   Yes Historical Provider, MD  gemfibrozil (LOPID) 600 MG tablet Take 600 mg by mouth 2 (two) times daily before a meal. Take 30 minutes  before breakfast and supper.   Yes Historical Provider, MD  levothyroxine (SYNTHROID, LEVOTHROID) 100 MCG tablet Take 100 mcg by mouth daily before breakfast.   Yes Historical Provider, MD  metoprolol (LOPRESSOR) 50 MG tablet Take 50 mg by mouth 2 (two) times daily.   Yes Historical Provider, MD  PARoxetine (PAXIL) 20 MG tablet Take 20 mg by mouth daily.   Yes Historical Provider, MD  rosuvastatin (CRESTOR) 20 MG tablet Take 20 mg by mouth daily.   Yes Historical Provider, MD    Allergies Haldol [haloperidol lactate]  No family history on file.  Social History Social History  Substance Use Topics  . Smoking status: Never Smoker  . Smokeless tobacco: Never Used  . Alcohol use Not on file    Review of Systems Unable to obtain review of systems due to chronic advanced dementia ____________________________________________   PHYSICAL EXAM:  VITAL SIGNS: ED Triage Vitals [09/04/15 2050]  Enc Vitals Group     BP (!) 125/54     Pulse Rate 63     Resp 18     Temp 98.2 F (36.8 C)     Temp Source Oral     SpO2 98 %     Weight 160 lb 13.6 oz (73 kg)     Height 5\' 10"  (1.778 m)     Head Circumference      Peak Flow      Pain Score  Pain Loc      Pain Edu?      Excl. in GC?     Constitutional: Alert and oriented. Well appearing and in no acute distress. Eyes: Conjunctivae are normal. PERRL. EOMI. Head: Atraumatic. Nose: No congestion/rhinnorhea.  There is a small amount of dried blood in her right naris with no evidence of active bleeding. Mouth/Throat: Mucous membranes are moist.  Oropharynx non-erythematous. Neck: No stridor.  No meningeal signs.   Cardiovascular: Normal rate, regular rhythm. Good peripheral circulation. Grossly normal heart sounds.   Respiratory: Normal respiratory effort.  No retractions. Lungs CTAB. Gastrointestinal: Soft and nontender. No distention.  Rectal:  External, non-bleeding hemorrhoid.  Bright red blood per rectum on digital exam,  strongly heme positive. Musculoskeletal: No lower extremity tenderness nor edema. No gross deformities of extremities. Neurologic:  Normal speech and language. No gross focal neurologic deficits are appreciated.  Skin:  Skin is warm, dry and intact. No rash noted.  ____________________________________________   LABS (all labs ordered are listed, but only abnormal results are displayed)  Labs Reviewed  COMPREHENSIVE METABOLIC PANEL - Abnormal; Notable for the following:       Result Value   Glucose, Bld 117 (*)    ALT 9 (*)    All other components within normal limits  CBC  POC OCCULT BLOOD, ED  TYPE AND SCREEN   ____________________________________________  EKG  ED ECG REPORT I, Annabelle Rexroad, the attending physician, personally viewed and interpreted this ECG.  Date: 09/04/2015 EKG Time: 20:57 Rate: 63 Rhythm: A flutter with variable AV block QRS Axis: normal Intervals: normal ST/T Wave abnormalities: normal Conduction Disturbances: none Narrative Interpretation: unremarkable  ____________________________________________  RADIOLOGY   No results found.  ____________________________________________   PROCEDURES  Procedure(s) performed:   Procedures   ____________________________________________   INITIAL IMPRESSION / ASSESSMENT AND PLAN / ED COURSE  Pertinent labs & imaging results that were available during my care of the patient were reviewed by me and considered in my medical decision making (see chart for details).  The patient is at her mental/psychiatric baseline according to her elderly sister who is her power of attorney.  She is not in any acute distress, has stable vital signs, and has a normal hemoglobin.  However she has bright red blood per rectum with no prior history and does not take blood thinners.  I had an extensive discussion at the bedside with the sister/power of attorney and the prior attorneys adult daughter.  We talked about what  the patient would want in terms of workup and treatment if she was able to speak for herself.  She currently has no DO NOT RESUSCITATE/DO NOT RESUSCITATE order in place, and the family feel strongly that they want a full workup to evaluate the cause of the GI bleeding.  I discussed the case with the hospitalist, but the problem is that we have no GI specialist on call this weekend.  As a result, no plan can be formulated and no one is available should she decompensate.  The family understands this and prefers that we call [facility] because of family they have present in [place].  I called through the transfer center and spoke by phone with Dr. Leim Fabry in the [facility] emergency Department.  She agreed to accept the patient in transfer and only asked that I make sure the family knows that we cannot guarantee exactly what sort of workup the GI team at [facility] will offer and that it may simply be a  matter of admitting her for serial H/Hs given her comorbidities.  I passed along this information to the patient's family and they understand and agree with the plan.  She is stable for transport by local [facility].    ____________________________________________  FINAL CLINICAL IMPRESSION(S) / ED DIAGNOSES  Final diagnoses:  Acute lower GI bleeding     MEDICATIONS GIVEN DURING THIS VISIT:  Medications - No data to display   NEW OUTPATIENT MEDICATIONS STARTED DURING THIS VISIT:  Discharge Medication List as of 09/04/2015 11:55 PM        Note:  This document was prepared using Dragon voice recognition software and may include unintentional dictation errors.    Loleta Rose, MD 09/05/15 [phone]

## 2015-09-04 NOTE — ED Notes (Signed)
Notified by registration that pt is trying to get out of bed.  Upon checking on pt, she is sitting at foot of stretcher, has pulled off leads, BP cuff.  Pt repositioned back in bed, IV wrapped in kerlex, pt re-oriented to place and situation.  Pt will not let me hook her back up to monitor at this time.  Consulting civil engineer notified.

## 2015-09-04 NOTE — ED Triage Notes (Signed)
Pt in via EMS from [facility].  Facility reports pt with bright red blood in stool this evening w/ a nosebleed occurring at the same time.  Pt alert to self only w/ hx of dementia.  Pt vitals WDL, no immediate distress at this time.

## 2015-09-04 NOTE — ED Notes (Signed)
Family at bedside at this time.  Update provided and advised them to call for help if she continued to try to get out of bed.

## 2015-12-12 ENCOUNTER — Emergency Department
Admission: EM | Admit: 2015-12-12 | Discharge: 2015-12-12 | Disposition: A | Discharge status: Home / Self Care | Payer: Medicare HMO | Attending: Emergency Medicine | Admitting: Emergency Medicine

## 2015-12-12 ENCOUNTER — Emergency Department: Payer: Medicare HMO

## 2015-12-12 DIAGNOSIS — Z0000 Encounter for general adult medical examination without abnormal findings: Secondary | ICD-10-CM | POA: Diagnosis not present

## 2015-12-12 DIAGNOSIS — Z139 Encounter for screening, unspecified: Secondary | ICD-10-CM

## 2015-12-12 DIAGNOSIS — I10 Essential (primary) hypertension: Secondary | ICD-10-CM | POA: Diagnosis not present

## 2015-12-12 DIAGNOSIS — G309 Alzheimer's disease, unspecified: Secondary | ICD-10-CM | POA: Insufficient documentation

## 2015-12-12 DIAGNOSIS — Z79899 Other long term (current) drug therapy: Secondary | ICD-10-CM | POA: Diagnosis not present

## 2015-12-12 DIAGNOSIS — R0602 Shortness of breath: Secondary | ICD-10-CM | POA: Diagnosis present

## 2015-12-12 NOTE — ED Triage Notes (Signed)
Pt from [facility] ED via [facility] c/o SOB. Per EMS the staff reported SOB with O2 sats in 70s; at the scene EMS reports O2 sats 92-99% RA w/o distress noted. Pt alert and oriented to self only, skin color wnl, RR even, in no acute distress at this time.

## 2015-12-12 NOTE — ED Provider Notes (Signed)
[facility] Emergency Department Provider Note  ____________________________________________  Time seen: Approximately 4:26 PM  I have reviewed the triage vital signs and the nursing notes.   HISTORY  Chief Complaint Shortness of Breath Level 5 caveat:  Portions of the history and physical were unable to be obtained due to the patient's chronic dementia    HPI Angie Larsen is a 82 y.o. female sent to the ED from her nursing facility due to concern about low oxygen level. According to the family, the patient had been noticed to have oxygen saturations in the 70s overnight. They continue to monitor the patient at the nursing facility until morning time. They discussed with on-call physician for this facility who apparently indicated that if the patient at maximum level LXX she should be transferred to the ER. When EMS arrived to pick patient up, they found that her oxygen level was actually about 95% and the patient appeared to be asymptomatic. At that time the patient and her family decided that she did not need to be seen in the ER and decided to stay home. However, the facility later called EMS back and insisted that the patient be sent to the emergency department anyway, apparently against the wishes of the patient and healthcare power of attorney family member. This is all as related to me by the family.  The patient denies any complaints. Family report no recent illnesses or other acute concerns.     Past Medical History:  Diagnosis Date  . Alzheimer's dementia   . Anxiety   . Depression   . GERD (gastroesophageal reflux disease)   . Hypertension      There are no active problems to display for this patient.    History reviewed. No pertinent surgical history.   Prior to Admission medications   Medication Sig Start Date End Date Taking? Authorizing Provider  acetaminophen (TYLENOL) 500 MG tablet Take 1,000 mg by mouth daily as needed for mild  pain.     Historical Provider, MD  divalproex (DEPAKOTE) 250 MG DR tablet Take 250 mg by mouth 2 (two) times daily.    Historical Provider, MD  docusate sodium (COLACE) 100 MG capsule Take 100 mg by mouth 2 (two) times daily.    Historical Provider, MD  gemfibrozil (LOPID) 600 MG tablet Take 600 mg by mouth 2 (two) times daily before a meal. Take 30 minutes before breakfast and supper.    Historical Provider, MD  levothyroxine (SYNTHROID, LEVOTHROID) 100 MCG tablet Take 100 mcg by mouth daily before breakfast.    Historical Provider, MD  metoprolol (LOPRESSOR) 50 MG tablet Take 50 mg by mouth 2 (two) times daily.    Historical Provider, MD  PARoxetine (PAXIL) 20 MG tablet Take 20 mg by mouth daily.    Historical Provider, MD  rosuvastatin (CRESTOR) 20 MG tablet Take 20 mg by mouth daily.    Historical Provider, MD     Allergies Haldol [haloperidol lactate]   History reviewed. No pertinent family history.  Social History Social History  Substance Use Topics  . Smoking status: Never Smoker  . Smokeless tobacco: Never Used  . Alcohol use Not on file    Review of Systems  Constitutional:   No fever or chills.  ENT:    No rhinorrhea. Cardiovascular:   No chest pain. Respiratory:   No dyspnea or cough. Gastrointestinal:  No vomiting and diarrhea.  Genitourinary:   No change in urination  10-point ROS otherwise negative.  ____________________________________________  PHYSICAL EXAM:  VITAL SIGNS: ED Triage Vitals  Enc Vitals Group     BP 12/12/15 1421 (!) 152/67     Pulse Rate 12/12/15 1421 (!) 101     Resp 12/12/15 1421 20     Temp 12/12/15 1421 97.6 F (36.4 C)     Temp Source 12/12/15 1421 Oral     SpO2 12/12/15 1421 100 %     Weight 12/12/15 1424 150 lb (68 kg)     Height 12/12/15 1424 5\' 7"  (1.702 m)     Head Circumference --      Peak Flow --      Pain Score --      Pain Loc --      Pain Edu? --      Excl. in GC? --     Vital signs reviewed, nursing  assessments reviewed.   Constitutional:   Alert and orientedTo self. Well appearing and in no distress. Eyes:   No scleral icterus. No conjunctival pallor. PERRL. EOMI.  No nystagmus. ENT   Head:   Normocephalic and atraumatic.   Nose:   No congestion/rhinnorhea. No septal hematoma   Mouth/Throat:   MMM, no pharyngeal erythema. No peritonsillar mass.    Neck:   No stridor. No SubQ emphysema. No meningismus. Hematological/Lymphatic/Immunilogical:   No cervical lymphadenopathy. Cardiovascular:   RRR heart rate 90. Symmetric bilateral radial and DP pulses.  No murmurs.  Respiratory:   Normal respiratory effort without tachypnea nor retractions. Breath sounds are clear and equal bilaterally. No wheezes/rales/rhonchi. Gastrointestinal:   Soft and nontender. Non distended. There is no CVA tenderness.  No rebound, rigidity, or guarding. Genitourinary:   deferred Musculoskeletal:   Nontender with normal range of motion in all extremities. No joint effusions.  No lower extremity tenderness.  No edema. Neurologic:   Normal speech and language.  CN 2-10 normal. Motor grossly intact. No gross focal neurologic deficits are appreciated.  Skin:    Skin is warm, dry and intact. No rash noted.  No petechiae, purpura, or bullae.  ____________________________________________    LABS (pertinent positives/negatives) (all labs ordered are listed, but only abnormal results are displayed) Labs Reviewed - No data to display ____________________________________________   EKG  Interpreted by me  Date: 12/12/2015  Rate: 95  Rhythm: normal sinus rhythm  QRS Axis: normal  Intervals: normal  ST/T Wave abnormalities: normal  Conduction Disutrbances: none  Narrative Interpretation: unremarkable      ____________________________________________    RADIOLOGY  Chest x-ray  unremarkable  ____________________________________________   PROCEDURES Procedures  ____________________________________________   INITIAL IMPRESSION / ASSESSMENT AND PLAN / ED COURSE  Pertinent labs & imaging results that were available during my care of the patient were reviewed by me and considered in my medical decision making (see chart for details).  Patient well appearing no acute distress. Oxygen saturation 100% on room air, normal work of breathing, unremarkable exam. EKG and chest x-ray unremarkable. It's unclear what may have happened earlier today, possibly atelectasis or mucous plugging of the airway, for faulty pulse oximeter equipment or technique at the nursing home. Regardless, the patient is very well-appearing with completely normal oxygen saturation now, unremarkable chest x-ray. No further testing is warranted. We'll discharge the patient home to follow up with primary care.     Clinical Course    ____________________________________________   FINAL CLINICAL IMPRESSION(S) / ED DIAGNOSES  Final diagnoses:  Encounter for medical screening examination       Portions of this note were  generated with Scientist, clinical (histocompatibility and immunogenetics)dragon dictation software. Dictation errors may occur despite best attempts at proofreading.    Sharman CheekPhillip Abby Tucholski, MD 12/12/15 1630

## 2015-12-12 NOTE — ED Notes (Signed)
Patient transported to X-ray 

## 2015-12-12 NOTE — Discharge Instructions (Signed)
Your oxygen level in the ED today is 100%.  Your chest xray and EKG are unremarkable.  Follow up with your doctor for continued monitoring of your symptoms.

## 2015-12-21 ENCOUNTER — Emergency Department: Payer: Medicare HMO

## 2015-12-21 ENCOUNTER — Emergency Department
Admission: EM | Admit: 2015-12-21 | Discharge: 2015-12-21 | Disposition: A | Discharge status: Home / Self Care | Payer: Medicare HMO | Attending: Emergency Medicine | Admitting: Emergency Medicine

## 2015-12-21 ENCOUNTER — Encounter: Payer: Self-pay | Admitting: Emergency Medicine

## 2015-12-21 DIAGNOSIS — I10 Essential (primary) hypertension: Secondary | ICD-10-CM | POA: Insufficient documentation

## 2015-12-21 DIAGNOSIS — G308 Other Alzheimer's disease: Secondary | ICD-10-CM | POA: Diagnosis not present

## 2015-12-21 DIAGNOSIS — Z79899 Other long term (current) drug therapy: Secondary | ICD-10-CM | POA: Insufficient documentation

## 2015-12-21 DIAGNOSIS — F0280 Dementia in other diseases classified elsewhere without behavioral disturbance: Secondary | ICD-10-CM | POA: Insufficient documentation

## 2015-12-21 DIAGNOSIS — R0602 Shortness of breath: Secondary | ICD-10-CM | POA: Diagnosis present

## 2015-12-21 DIAGNOSIS — Z711 Person with feared health complaint in whom no diagnosis is made: Secondary | ICD-10-CM | POA: Insufficient documentation

## 2015-12-21 LAB — CBC WITH DIFFERENTIAL/PLATELET
BASOS ABS: 0.1 10*3/uL (ref 0–0.1)
BASOS PCT: 1 %
EOS ABS: 0.3 10*3/uL (ref 0–0.7)
Eosinophils Relative: 3 %
HCT: 35.7 % (ref 35.0–47.0)
HEMOGLOBIN: 11.6 g/dL — AB (ref 12.0–16.0)
Lymphocytes Relative: 26 %
Lymphs Abs: 2.5 10*3/uL (ref 1.0–3.6)
MCH: 28.7 pg (ref 26.0–34.0)
MCHC: 32.4 g/dL (ref 32.0–36.0)
MCV: 88.5 fL (ref 80.0–100.0)
Monocytes Absolute: 0.9 10*3/uL (ref 0.2–0.9)
Monocytes Relative: 9 %
NEUTROS PCT: 61 %
Neutro Abs: 6 10*3/uL (ref 1.4–6.5)
Platelets: 316 10*3/uL (ref 150–440)
RBC: 4.04 MIL/uL (ref 3.80–5.20)
RDW: 14.6 % — ABNORMAL HIGH (ref 11.5–14.5)
WBC: 9.7 10*3/uL (ref 3.6–11.0)

## 2015-12-21 LAB — BASIC METABOLIC PANEL
ANION GAP: 8 (ref 5–15)
BUN: 17 mg/dL (ref 6–20)
CHLORIDE: 102 mmol/L (ref 101–111)
CO2: 26 mmol/L (ref 22–32)
CREATININE: 0.62 mg/dL (ref 0.44–1.00)
Calcium: 8.9 mg/dL (ref 8.9–10.3)
GFR calc non Af Amer: >60 mL/min (ref >60)
Glucose, Bld: 166 mg/dL — ABNORMAL HIGH (ref 65–99)
POTASSIUM: 4.1 mmol/L (ref 3.5–5.1)
SODIUM: 136 mmol/L (ref 135–145)

## 2015-12-21 NOTE — Discharge Instructions (Signed)
Continue your current meds.   Your oxygen level is normal today.   See your doctor  Return to ER if she has trouble breathing, turning blue, fever > 101, shortness of breath.

## 2015-12-21 NOTE — ED Provider Notes (Signed)
[facility]-EMERGENCY DEPARTMENT Provider Note   CSN: [id number] Arrival date & time: 12/21/15  1719     History   Chief Complaint Chief Complaint  Patient presents with  . Shortness of Breath    HPI Tilda FrancoMary L Angeletti is a 82 y.o. female hx of dementia, depression, GERD, Here presenting with possible hypoxia. She resides at a dementia unit. She was seen here several days ago for possible hypoxia but was noted to not be hypoxic per EMS or in the ED. ShDictation #1 [id number]  [id number] e had a normal chest x-ray at that time was sent back to the facility. Today, she was noted to have an oxygen level of 85% in the facility. EMS was called and her oxygen was 96% at that time. Patient states that she has no complaints and denies cough or fever. She is demented and unable to give much history but she says she feels fine   The history is provided by the patient and the EMS personnel.   Level V caveat- dementia    Past Medical History:  Diagnosis Date  . Alzheimer's dementia   . Anxiety   . Depression   . GERD (gastroesophageal reflux disease)   . Hypertension     There are no active problems to display for this patient.   History reviewed. No pertinent surgical history.  OB History    No data available       Home Medications    Prior to Admission medications   Medication Sig Start Date End Date Taking? Authorizing Provider  acetaminophen (TYLENOL) 500 MG tablet Take 1,000 mg by mouth daily as needed for mild pain.    Yes Historical Provider, MD  divalproex (DEPAKOTE) 250 MG DR tablet Take 250 mg by mouth 2 (two) times daily.   Yes Historical Provider, MD  docusate sodium (COLACE) 100 MG capsule Take 100 mg by mouth 2 (two) times daily.   Yes Historical Provider, MD  gemfibrozil (LOPID) 600 MG tablet Take 600 mg by mouth 2 (two) times daily before a meal. Take 30 minutes before breakfast and supper.   Yes Historical Provider, MD  levothyroxine (SYNTHROID, LEVOTHROID) 100  MCG tablet Take 100 mcg by mouth daily before breakfast.   Yes Historical Provider, MD  metoprolol (LOPRESSOR) 50 MG tablet Take 50 mg by mouth 2 (two) times daily.   Yes Historical Provider, MD  PARoxetine (PAXIL) 10 MG tablet Take 10 mg by mouth daily.    Yes Historical Provider, MD  rosuvastatin (CRESTOR) 20 MG tablet Take 20 mg by mouth daily.   Yes Historical Provider, MD    Family History No family history on file.  Social History Social History  Substance Use Topics  . Smoking status: Never Smoker  . Smokeless tobacco: Never Used  . Alcohol use Not on file     Allergies   Haldol [haloperidol lactate]   Review of Systems Review of Systems  All other systems reviewed and are negative.    Physical Exam Updated Vital Signs BP (!) 152/67 (BP Location: Right Arm)   Pulse 83   Temp 97.5 F (36.4 C) (Oral)   Resp 17   SpO2 100%   Physical Exam  Constitutional: She is oriented to person, place, and time.  Demented, chronically ill but not acutely ill   HENT:  Head: Normocephalic.  Eyes: EOM are normal. Pupils are equal, round, and reactive to light.  Neck: Normal range of motion. Neck supple.  Cardiovascular: Normal rate and regular rhythm.  Pulmonary/Chest: Effort normal and breath sounds normal. No respiratory distress. She has no wheezes. She has no rales.  Abdominal: Soft. Bowel sounds are normal. She exhibits no distension. There is no tenderness. There is no guarding.  Musculoskeletal: Normal range of motion.  Neurological: She is alert and oriented to person, place, and time.  Skin: Skin is warm.  Psychiatric: She has a normal mood and affect.  Nursing note and vitals reviewed.    ED Treatments / Results  Labs (all labs ordered are listed, but only abnormal results are displayed) Labs Reviewed  CBC WITH DIFFERENTIAL/PLATELET - Abnormal; Notable for the following:       Result Value   Hemoglobin 11.6 (*)    RDW 14.6 (*)    All other components  within normal limits  BASIC METABOLIC PANEL - Abnormal; Notable for the following:    Glucose, Bld 166 (*)    All other components within normal limits    EKG  EKG Interpretation None      ED ECG REPORT I, Richardean Canalavid H Corianna Avallone, the attending physician, personally viewed and interpreted this ECG.   Date: 12/21/2015  EKG Time: 17:15 pm  Rate: 51  Rhythm: normal EKG, normal sinus rhythm  Axis: normal  Intervals:first-degree A-V block , IVCD  ST&T Change: nonspecific    Radiology Dg Chest 2 View  Result Date: 12/21/2015 CLINICAL DATA:  Hypoxia.  Poor historian. EXAM: CHEST  2 VIEW COMPARISON:  Chest radiograph December 12, 2015 FINDINGS: Cardiomediastinal silhouette is normal. Mildly calcified aortic knob. No pleural effusions or focal consolidations. Bibasilar strandy densities. Trachea projects midline and there is no pneumothorax. Soft tissue planes and included osseous structures are non-suspicious. Osteopenia. IMPRESSION: Mild bibasilar atelectasis. Electronically Signed   By: Awilda Metroourtnay  Bloomer M.D.   On: 12/21/2015 17:55    Procedures Procedures (including critical care time)  Medications Ordered in ED Medications - No data to display   Initial Impression / Assessment and Plan / ED Course  I have reviewed the triage vital signs and the nursing notes.  Pertinent labs & imaging results that were available during my care of the patient were reviewed by me and considered in my medical decision making (see chart for details).  Clinical Course     Tilda FrancoMary L Eldridge is a 82 y.o. female here with possible hypoxia. Not hypoxic per EMS or in the ED. Patient demented but has no symptoms. EKG unremarkable. Labs at baseline. CXR showed no acute infiltrate. Likely erroneous pulse ox at the facility. Will dc back to facility.     Final Clinical Impressions(s) / ED Diagnoses   Final diagnoses:  None    New Prescriptions New Prescriptions   No medications on file     Charlynne Panderavid Hsienta  Adebayo Ensminger, MD 12/21/15 1939

## 2015-12-21 NOTE — ED Triage Notes (Signed)
[facility]. Per EMS facility states that pt was reading low o2sats in 80s. Per EMS pt was 96% on RA en route. VS stable. Pt Alert, disoriented to place and time, hx of dementia.

## 2015-12-21 NOTE — ED Notes (Signed)
Pt sister, (POA), mrs. Hunter, called to check on pt at this time

## 2016-02-17 ENCOUNTER — Encounter: Payer: Self-pay | Admitting: Emergency Medicine

## 2016-02-17 ENCOUNTER — Inpatient Hospital Stay
Admission: EM | Admit: 2016-02-17 | Discharge: 2016-02-24 | DRG: 871 | Disposition: A | Discharge status: Skilled Nursing Facility | Payer: Medicare Other | Attending: Internal Medicine | Admitting: Internal Medicine

## 2016-02-17 ENCOUNTER — Emergency Department: Payer: Medicare Other

## 2016-02-17 DIAGNOSIS — M6281 Muscle weakness (generalized): Secondary | ICD-10-CM

## 2016-02-17 DIAGNOSIS — R627 Adult failure to thrive: Secondary | ICD-10-CM | POA: Diagnosis not present

## 2016-02-17 DIAGNOSIS — J189 Pneumonia, unspecified organism: Secondary | ICD-10-CM | POA: Diagnosis not present

## 2016-02-17 DIAGNOSIS — R000 Tachycardia, unspecified: Secondary | ICD-10-CM | POA: Diagnosis present

## 2016-02-17 DIAGNOSIS — F419 Anxiety disorder, unspecified: Secondary | ICD-10-CM | POA: Diagnosis not present

## 2016-02-17 DIAGNOSIS — R634 Abnormal weight loss: Secondary | ICD-10-CM | POA: Diagnosis not present

## 2016-02-17 DIAGNOSIS — E039 Hypothyroidism, unspecified: Secondary | ICD-10-CM | POA: Diagnosis not present

## 2016-02-17 DIAGNOSIS — A419 Sepsis, unspecified organism: Principal | ICD-10-CM | POA: Diagnosis present

## 2016-02-17 DIAGNOSIS — K219 Gastro-esophageal reflux disease without esophagitis: Secondary | ICD-10-CM | POA: Diagnosis present

## 2016-02-17 DIAGNOSIS — I10 Essential (primary) hypertension: Secondary | ICD-10-CM | POA: Diagnosis present

## 2016-02-17 DIAGNOSIS — Z888 Allergy status to other drugs, medicaments and biological substances status: Secondary | ICD-10-CM

## 2016-02-17 DIAGNOSIS — G309 Alzheimer's disease, unspecified: Secondary | ICD-10-CM | POA: Diagnosis not present

## 2016-02-17 DIAGNOSIS — Z79899 Other long term (current) drug therapy: Secondary | ICD-10-CM | POA: Diagnosis not present

## 2016-02-17 DIAGNOSIS — E785 Hyperlipidemia, unspecified: Secondary | ICD-10-CM | POA: Diagnosis present

## 2016-02-17 DIAGNOSIS — F329 Major depressive disorder, single episode, unspecified: Secondary | ICD-10-CM | POA: Diagnosis not present

## 2016-02-17 DIAGNOSIS — F0280 Dementia in other diseases classified elsewhere without behavioral disturbance: Secondary | ICD-10-CM | POA: Diagnosis present

## 2016-02-17 DIAGNOSIS — Y95 Nosocomial condition: Secondary | ICD-10-CM | POA: Diagnosis present

## 2016-02-17 DIAGNOSIS — R2681 Unsteadiness on feet: Secondary | ICD-10-CM

## 2016-02-17 DIAGNOSIS — R0602 Shortness of breath: Secondary | ICD-10-CM

## 2016-02-17 LAB — RAPID INFLUENZA A&B ANTIGENS (ARMC ONLY): INFLUENZA B (ARMC): NEGATIVE

## 2016-02-17 LAB — BASIC METABOLIC PANEL
Anion gap: 7 (ref 5–15)
BUN: 12 mg/dL (ref 6–20)
CHLORIDE: 99 mmol/L — AB (ref 101–111)
CO2: 28 mmol/L (ref 22–32)
CREATININE: 0.41 mg/dL — AB (ref 0.44–1.00)
Calcium: 8.5 mg/dL — ABNORMAL LOW (ref 8.9–10.3)
GFR calc Af Amer: >60 mL/min (ref >60)
GFR calc non Af Amer: >60 mL/min (ref >60)
GLUCOSE: 146 mg/dL — AB (ref 65–99)
Potassium: 3.9 mmol/L (ref 3.5–5.1)
Sodium: 134 mmol/L — ABNORMAL LOW (ref 135–145)

## 2016-02-17 LAB — URINALYSIS, COMPLETE (UACMP) WITH MICROSCOPIC
BACTERIA UA: NONE SEEN
Bilirubin Urine: NEGATIVE
Glucose, UA: NEGATIVE mg/dL
Hgb urine dipstick: NEGATIVE
KETONES UR: NEGATIVE mg/dL
LEUKOCYTES UA: NEGATIVE
Nitrite: NEGATIVE
PH: 7 (ref 5.0–8.0)
Protein, ur: NEGATIVE mg/dL
Specific Gravity, Urine: 1.017 (ref 1.005–1.030)

## 2016-02-17 LAB — CBC
HCT: 32.5 % — ABNORMAL LOW (ref 35.0–47.0)
Hemoglobin: 10.6 g/dL — ABNORMAL LOW (ref 12.0–16.0)
MCH: 27.2 pg (ref 26.0–34.0)
MCHC: 32.6 g/dL (ref 32.0–36.0)
MCV: 83.3 fL (ref 80.0–100.0)
PLATELETS: 328 10*3/uL (ref 150–440)
RBC: 3.9 MIL/uL (ref 3.80–5.20)
RDW: 16.7 % — AB (ref 11.5–14.5)
WBC: 11 10*3/uL (ref 3.6–11.0)

## 2016-02-17 LAB — MRSA PCR SCREENING: MRSA by PCR: NEGATIVE

## 2016-02-17 LAB — RAPID INFLUENZA A&B ANTIGENS: Influenza A (ARMC): NEGATIVE

## 2016-02-17 LAB — TSH: TSH: 3.627 u[IU]/mL (ref 0.350–4.500)

## 2016-02-17 LAB — TROPONIN I: Troponin I: <0.03 ng/mL (ref <0.03)

## 2016-02-17 LAB — LACTIC ACID, PLASMA: Lactic Acid, Venous: 1.2 mmol/L (ref 0.5–1.9)

## 2016-02-17 MED ORDER — SODIUM CHLORIDE 0.9 % IV SOLN
INTRAVENOUS | Status: DC
  Administered 2016-02-17: 1000 mL via INTRAVENOUS
  Administered 2016-02-18 – 2016-02-22 (×7): via INTRAVENOUS

## 2016-02-17 MED ORDER — SODIUM CHLORIDE 0.9% FLUSH
3.0000 mL | Freq: Two times a day (BID) | INTRAVENOUS | Status: DC
  Administered 2016-02-17 – 2016-02-24 (×11): 3 mL via INTRAVENOUS

## 2016-02-17 MED ORDER — LEVOTHYROXINE SODIUM 50 MCG PO TABS
ORAL_TABLET | ORAL | Status: AC
  Filled 2016-02-17: qty 2

## 2016-02-17 MED ORDER — DOCUSATE SODIUM 100 MG PO CAPS
ORAL_CAPSULE | ORAL | Status: AC
  Filled 2016-02-17: qty 1

## 2016-02-17 MED ORDER — SODIUM CHLORIDE 0.9 % IV BOLUS (SEPSIS)
1000.0000 mL | Freq: Once | INTRAVENOUS | Status: AC
  Administered 2016-02-17: 1000 mL via INTRAVENOUS

## 2016-02-17 MED ORDER — ACETAMINOPHEN 325 MG PO TABS
650.0000 mg | ORAL_TABLET | Freq: Four times a day (QID) | ORAL | Status: DC | PRN
  Administered 2016-02-17 – 2016-02-20 (×3): 650 mg via ORAL
  Filled 2016-02-17 (×3): qty 2

## 2016-02-17 MED ORDER — VANCOMYCIN HCL IN DEXTROSE 750-5 MG/150ML-% IV SOLN
750.0000 mg | Freq: Two times a day (BID) | INTRAVENOUS | Status: DC
  Administered 2016-02-17 (×2): 750 mg via INTRAVENOUS
  Filled 2016-02-17 (×4): qty 150

## 2016-02-17 MED ORDER — DIVALPROEX SODIUM 250 MG PO DR TAB
250.0000 mg | DELAYED_RELEASE_TABLET | Freq: Two times a day (BID) | ORAL | Status: DC
  Administered 2016-02-17: 250 mg via ORAL
  Filled 2016-02-17 (×2): qty 1

## 2016-02-17 MED ORDER — VANCOMYCIN HCL IN DEXTROSE 1-5 GM/200ML-% IV SOLN
1000.0000 mg | Freq: Once | INTRAVENOUS | Status: AC
Start: 2016-02-17 — End: 2016-02-17
  Administered 2016-02-17: 1000 mg via INTRAVENOUS
  Filled 2016-02-17: qty 200

## 2016-02-17 MED ORDER — PIPERACILLIN-TAZOBACTAM 3.375 G IVPB
3.3750 g | Freq: Once | INTRAVENOUS | Status: AC
  Administered 2016-02-17: 3.375 g via INTRAVENOUS
  Filled 2016-02-17: qty 50

## 2016-02-17 MED ORDER — ENOXAPARIN SODIUM 40 MG/0.4ML ~~LOC~~ SOLN
SUBCUTANEOUS | Status: AC
  Administered 2016-02-17: 40 mg via SUBCUTANEOUS
  Filled 2016-02-17: qty 0.4

## 2016-02-17 MED ORDER — METOPROLOL TARTRATE 50 MG PO TABS
50.0000 mg | ORAL_TABLET | Freq: Two times a day (BID) | ORAL | Status: DC
  Administered 2016-02-17 – 2016-02-24 (×15): 50 mg via ORAL
  Filled 2016-02-17 (×16): qty 1

## 2016-02-17 MED ORDER — DOCUSATE SODIUM 100 MG PO CAPS
100.0000 mg | ORAL_CAPSULE | Freq: Two times a day (BID) | ORAL | Status: DC
Start: 2016-02-17 — End: 2016-02-24
  Administered 2016-02-17 – 2016-02-24 (×15): 100 mg via ORAL
  Filled 2016-02-17 (×14): qty 1

## 2016-02-17 MED ORDER — DIVALPROEX SODIUM 250 MG PO DR TAB
DELAYED_RELEASE_TABLET | ORAL | Status: AC
  Filled 2016-02-17: qty 1

## 2016-02-17 MED ORDER — ROSUVASTATIN CALCIUM 20 MG PO TABS
20.0000 mg | ORAL_TABLET | Freq: Every day | ORAL | Status: DC
  Administered 2016-02-17: 20 mg via ORAL
  Filled 2016-02-17: qty 1

## 2016-02-17 MED ORDER — ONDANSETRON HCL 4 MG/2ML IJ SOLN: 4.0000 mg | Freq: Four times a day (QID) | INTRAMUSCULAR | Status: DC | PRN

## 2016-02-17 MED ORDER — ONDANSETRON HCL 4 MG PO TABS
4.0000 mg | ORAL_TABLET | Freq: Four times a day (QID) | ORAL | Status: DC | PRN
  Filled 2016-02-17: qty 1

## 2016-02-17 MED ORDER — ACETAMINOPHEN 650 MG RE SUPP
650.0000 mg | Freq: Four times a day (QID) | RECTAL | Status: DC | PRN
  Filled 2016-02-17: qty 1

## 2016-02-17 MED ORDER — PAROXETINE HCL 10 MG PO TABS
10.0000 mg | ORAL_TABLET | Freq: Every day | ORAL | Status: DC
  Administered 2016-02-17: 10 mg via ORAL
  Filled 2016-02-17: qty 1

## 2016-02-17 MED ORDER — AZITHROMYCIN 500 MG PO TABS
500.0000 mg | ORAL_TABLET | Freq: Every day | ORAL | Status: DC
  Administered 2016-02-17 – 2016-02-22 (×6): 500 mg via ORAL
  Filled 2016-02-17 (×6): qty 1

## 2016-02-17 MED ORDER — PIPERACILLIN-TAZOBACTAM 3.375 G IVPB
3.3750 g | Freq: Three times a day (TID) | INTRAVENOUS | Status: DC
  Administered 2016-02-17 – 2016-02-18 (×4): 3.375 g via INTRAVENOUS
  Filled 2016-02-17 (×7): qty 50

## 2016-02-17 MED ORDER — ENOXAPARIN SODIUM 40 MG/0.4ML ~~LOC~~ SOLN
40.0000 mg | SUBCUTANEOUS | Status: DC
  Administered 2016-02-17 – 2016-02-24 (×8): 40 mg via SUBCUTANEOUS
  Filled 2016-02-17 (×9): qty 0.4

## 2016-02-17 MED ORDER — METOPROLOL TARTRATE 50 MG PO TABS
ORAL_TABLET | ORAL | Status: AC
  Filled 2016-02-17: qty 1

## 2016-02-17 MED ORDER — SODIUM CHLORIDE 0.9 % IV BOLUS (SEPSIS)
250.0000 mL | Freq: Once | INTRAVENOUS | Status: AC
  Administered 2016-02-17: 250 mL via INTRAVENOUS

## 2016-02-17 MED ORDER — GEMFIBROZIL 600 MG PO TABS
600.0000 mg | ORAL_TABLET | Freq: Two times a day (BID) | ORAL | Status: DC
  Administered 2016-02-17: 600 mg via ORAL
  Filled 2016-02-17 (×2): qty 1

## 2016-02-17 MED ORDER — LEVOTHYROXINE SODIUM 100 MCG PO TABS
100.0000 ug | ORAL_TABLET | Freq: Every day | ORAL | Status: DC
  Administered 2016-02-17 – 2016-02-24 (×8): 100 ug via ORAL
  Filled 2016-02-17 (×8): qty 1

## 2016-02-17 MED ORDER — PAROXETINE HCL 20 MG PO TABS
ORAL_TABLET | ORAL | Status: AC
  Filled 2016-02-17: qty 1

## 2016-02-17 MED ORDER — TRAMADOL HCL 50 MG PO TABS
50.0000 mg | ORAL_TABLET | Freq: Four times a day (QID) | ORAL | Status: DC | PRN
  Administered 2016-02-17: 50 mg via ORAL
  Filled 2016-02-17: qty 1

## 2016-02-17 NOTE — ED Notes (Signed)
Admitting MD at the bedside for pt evaluation.  

## 2016-02-17 NOTE — H&P (Signed)
Angie Larsen is an 83 y.o. female.    Chief Complaint: Tachycardia HPI: The patient with past medical history of dementia and hypertension presents to the emergency department from her nursing home due to tachycardia. Apparently the patient made some gestured toward her chest at the time the facility staff was evaluating her which gave them concern for chest pain. The patient does not verbalize this complaint. In the emergency department however she was found to be febrile and tachypneic. Chest x-ray showed multifocal pneumonia. Dr. protocol was initiated and the emergency department staff called the hospital service for admission.  Past Medical History:  Diagnosis Date  . Alzheimer's dementia   . Anxiety   . Depression   . GERD (gastroesophageal reflux disease)   . Hypertension     History reviewed. No pertinent surgical history. Unable to obtain due to dementia  No family history on file. Unable to obtain due to dementia Social History:  reports that she has never smoked. She has never used smokeless tobacco. She reports that she does not drink alcohol. Her drug history is not on file.  Allergies:  Allergies  Allergen Reactions  . Haldol [Haloperidol Lactate] Hives    Prior to Admission medications   Medication Sig Start Date End Date Taking? Authorizing Provider  acetaminophen (TYLENOL) 500 MG tablet Take 1,000 mg by mouth daily as needed for mild pain.     Historical Provider, MD  divalproex (DEPAKOTE) 250 MG DR tablet Take 250 mg by mouth 2 (two) times daily.    Historical Provider, MD  docusate sodium (COLACE) 100 MG capsule Take 100 mg by mouth 2 (two) times daily.    Historical Provider, MD  gemfibrozil (LOPID) 600 MG tablet Take 600 mg by mouth 2 (two) times daily before a meal. Take 30 minutes before breakfast and supper.    Historical Provider, MD  levothyroxine (SYNTHROID, LEVOTHROID) 100 MCG tablet Take 100 mcg by mouth daily before breakfast.    Historical Provider, MD   metoprolol (LOPRESSOR) 50 MG tablet Take 50 mg by mouth 2 (two) times daily.    Historical Provider, MD  PARoxetine (PAXIL) 10 MG tablet Take 10 mg by mouth daily.     Historical Provider, MD  rosuvastatin (CRESTOR) 20 MG tablet Take 20 mg by mouth daily.    Historical Provider, MD     Results for orders placed or performed during the hospital encounter of 02/17/16 (from the past 48 hour(s))  Basic metabolic panel     Status: Abnormal   Collection Time: 02/17/16 12:49 AM  Result Value Ref Range   Sodium 134 (L) 135 - 145 mmol/L   Potassium 3.9 3.5 - 5.1 mmol/L   Chloride 99 (L) 101 - 111 mmol/L   CO2 28 22 - 32 mmol/L   Glucose, Bld 146 (H) 65 - 99 mg/dL   BUN 12 6 - 20 mg/dL   Creatinine, Ser 0.41 (L) 0.44 - 1.00 mg/dL   Calcium 8.5 (L) 8.9 - 10.3 mg/dL   GFR calc non Af Amer >60 >60 mL/min   GFR calc Af Amer >60 >60 mL/min    Comment: (NOTE) The eGFR has been calculated using the CKD EPI equation. This calculation has not been validated in all clinical situations. eGFR's persistently <60 mL/min signify possible Chronic Kidney Disease.    Anion gap 7 5 - 15  CBC     Status: Abnormal   Collection Time: 02/17/16 12:49 AM  Result Value Ref Range   WBC 11.0  3.6 - 11.0 K/uL   RBC 3.90 3.80 - 5.20 MIL/uL   Hemoglobin 10.6 (L) 12.0 - 16.0 g/dL   HCT 32.5 (L) 35.0 - 47.0 %   MCV 83.3 80.0 - 100.0 fL   MCH 27.2 26.0 - 34.0 pg   MCHC 32.6 32.0 - 36.0 g/dL   RDW 16.7 (H) 11.5 - 14.5 %   Platelets 328 150 - 440 K/uL  Troponin I     Status: None   Collection Time: 02/17/16 12:49 AM  Result Value Ref Range   Troponin I <0.03 <0.03 ng/mL  Urinalysis, Complete w Microscopic     Status: Abnormal   Collection Time: 02/17/16 12:49 AM  Result Value Ref Range   Color, Urine YELLOW (A) YELLOW   APPearance CLEAR (A) CLEAR   Specific Gravity, Urine 1.017 1.005 - 1.030   pH 7.0 5.0 - 8.0   Glucose, UA NEGATIVE NEGATIVE mg/dL   Hgb urine dipstick NEGATIVE NEGATIVE   Bilirubin Urine  NEGATIVE NEGATIVE   Ketones, ur NEGATIVE NEGATIVE mg/dL   Protein, ur NEGATIVE NEGATIVE mg/dL   Nitrite NEGATIVE NEGATIVE   Leukocytes, UA NEGATIVE NEGATIVE   RBC / HPF 0-5 0 - 5 RBC/hpf   WBC, UA 0-5 0 - 5 WBC/hpf   Bacteria, UA NONE SEEN NONE SEEN   Squamous Epithelial / LPF 0-5 (A) NONE SEEN  Rapid Influenza A&B Antigens ([facility] only)     Status: None   Collection Time: 02/17/16 12:49 AM  Result Value Ref Range   Influenza A ([facility]) NEGATIVE NEGATIVE   Influenza B ([facility]) NEGATIVE NEGATIVE  TSH     Status: None   Collection Time: 02/17/16 12:49 AM  Result Value Ref Range   TSH 3.627 0.350 - 4.500 uIU/mL    Comment: Performed by a 3rd Generation assay with a functional sensitivity of <=0.01 uIU/mL.  Lactic acid, plasma     Status: None   Collection Time: 02/17/16  2:45 AM  Result Value Ref Range   Lactic Acid, Venous 1.2 0.5 - 1.9 mmol/L   Dg Chest Port 1 View  Result Date: 02/17/2016 CLINICAL DATA:  Fever and chest pain.  Nonsmoker. EXAM: PORTABLE CHEST 1 VIEW COMPARISON:  12/21/2015 FINDINGS: Infiltration in the right lung base with focal masslike opacity. This is all new since previous study. Short interval development suggestive this is likely due to pneumonia rather than mass lesion. Atelectasis in the left lung base. Heart size and pulmonary vascularity are normal. No blunting of costophrenic angles. No pneumothorax. Mediastinal contours appear intact. IMPRESSION: Right lower lung opacity is new since previous study, likely representing pneumonia. Followup PA and lateral chest X-ray is recommended in 3-4 weeks following appropriate clinical therapy to ensure resolution and exclude underlying malignancy. Electronically Signed   By: Lucienne Capers M.D.   On: 02/17/2016 01:45    Review of Systems  Unable to perform ROS: Dementia    Blood pressure (!) 115/52, pulse 97, temperature (!) 100.6 F (38.1 C), temperature source Axillary, resp. rate 19, height 5' 6"  (1.676 m), weight  72.6 kg (160 lb), SpO2 94 %. Physical Exam  Vitals reviewed. Constitutional: She is oriented to person, place, and time. She appears well-developed and well-nourished. No distress.  HENT:  Head: Normocephalic and atraumatic.  Mouth/Throat: Oropharynx is clear and moist.  Eyes: Conjunctivae and EOM are normal. Pupils are equal, round, and reactive to light. No scleral icterus.  Neck: Normal range of motion. Neck supple. No JVD present. No tracheal deviation present. No  thyromegaly present.  Cardiovascular: Normal rate, regular rhythm and normal heart sounds.  Exam reveals no gallop and no friction rub.   No murmur heard. Respiratory: Effort normal and breath sounds normal.  GI: Soft. Bowel sounds are normal. She exhibits no distension. There is no tenderness.  Genitourinary:  Genitourinary Comments: Deferred  Musculoskeletal: Normal range of motion. She exhibits edema.  Lymphadenopathy:    She has no cervical adenopathy.  Neurological: She is alert and oriented to person, place, and time. No cranial nerve deficit. She exhibits normal muscle tone.  Skin: Skin is warm and dry.  Psychiatric:  Difficult to assess due to dementia     Assessment/Plan This is an 83 year old female admitted for sepsis. 1. Sepsis: The patient's criteria via fever, tachycardia and tachypnea. She is hemodynamically stable. Follow blood cultures for growth and sensitivities. Continue broad-spectrum antibiotics. 2. Healthcare associated pneumonia: Continue vancomycin and Zosyn. 3. Dementia: Continue Paxil and Depakote for mood stabilization 4. Hypertension: Controlled; continue metoprolol 5. Hypothyroidism: Continue Synthroid. 6. Dyslipidemia: Continue gemfibrozil as well as statin therapy 6. DVT prophylaxis: Lovenox 7. GI prophylaxis: None The patient is a full code. Time spent on admission orders and patient care approximately 45 minutes  Harrie Foreman, MD 02/17/2016, 8:01 AM

## 2016-02-17 NOTE — Progress Notes (Signed)
Pharmacy Antibiotic Note  Tilda FrancoMary L Pontillo is a [age] y.o. female admitted on 02/17/2016 with pneumonia.  Pharmacy has been consulted for vancomycin and zosyn dosing. Patient admitted from memory care unit with multifocal PNA  Plan: Vancomycin 1gm x 1 given in ED. Will follow with vancomycin 750mg  IV Q12H to target trough of 15-8920mcg/ml.  MRSA PCR pending, will discontinue if negative.  Zosyn 3.375gm IV Q8H extended infusion  Height: 5\' 6"  (167.6 cm) Weight: 160 lb (72.6 kg) IBW/kg (Calculated) : 59.3  Temp (24hrs), Avg:100.7 F (38.2 C), Min:100.6 F (38.1 C), Max:100.7 F (38.2 C)   Recent Labs Lab 02/17/16 0049 02/17/16 0245  WBC 11.0  --   CREATININE 0.41*  --   LATICACIDVEN  --  1.2    Estimated Creatinine Clearance: 57.2 mL/min (by C-G formula based on SCr of 0.41 mg/dL (L)).    Allergies  Allergen Reactions  . Haldol [Haloperidol Lactate] Hives    Antimicrobials this admission: Vancomycin 1/11 >>  Zosyn 1/11 >>   Dose adjustments this admission:   Microbiology results: 1/11 BCx:    1/11 MRSA PCR:   Thank you for allowing pharmacy to be a part of this patient's care.  Aundrea Horace C 02/17/2016 10:09 AM

## 2016-02-17 NOTE — ED Notes (Signed)
Pt resting quietly on stretcher with eyes closed and even respirations. No increased work of breathing or acute distress noted. Pt awakens easily to verbal stimuli. Provided for comfort and safety and will continue to assess.

## 2016-02-17 NOTE — ED Provider Notes (Signed)
[facility] Emergency Department Provider Note   First MD Initiated Contact with Patient 02/17/16 [phone]     (approximate)  I have reviewed the triage vital signs and the nursing notes.  History Limited secondary to Alzheimer's dementia. HISTORY  Chief Complaint Chest Pain   HPI Angie Larsen is a 83 y.o. female presents via EMS from [facility] for "possible chest pain". Per EMS the staff at [facility] stated that the patient pointed to her chest and complained of pain which resulted in her transfer to the emergency department. On arrival to emergency department patient noted to have a fever of 100.7   Past Medical History:  Diagnosis Date  . Alzheimer's dementia   . Anxiety   . Depression   . GERD (gastroesophageal reflux disease)   . Hypertension     There are no active problems to display for this patient.   History reviewed. No pertinent surgical history.  Prior to Admission medications   Medication Sig Start Date End Date Taking? Authorizing Provider  acetaminophen (TYLENOL) 500 MG tablet Take 1,000 mg by mouth daily as needed for mild pain.     Historical Provider, MD  divalproex (DEPAKOTE) 250 MG DR tablet Take 250 mg by mouth 2 (two) times daily.    Historical Provider, MD  docusate sodium (COLACE) 100 MG capsule Take 100 mg by mouth 2 (two) times daily.    Historical Provider, MD  gemfibrozil (LOPID) 600 MG tablet Take 600 mg by mouth 2 (two) times daily before a meal. Take 30 minutes before breakfast and supper.    Historical Provider, MD  levothyroxine (SYNTHROID, LEVOTHROID) 100 MCG tablet Take 100 mcg by mouth daily before breakfast.    Historical Provider, MD  metoprolol (LOPRESSOR) 50 MG tablet Take 50 mg by mouth 2 (two) times daily.    Historical Provider, MD  PARoxetine (PAXIL) 10 MG tablet Take 10 mg by mouth daily.     Historical Provider, MD  rosuvastatin (CRESTOR) 20 MG tablet Take 20 mg by mouth daily.    Historical  Provider, MD    Allergies Haldol [haloperidol lactate]  No family history on file.  Social History Social History  Substance Use Topics  . Smoking status: Never Smoker  . Smokeless tobacco: Never Used  . Alcohol use No    Review of Systems Constitutional:Oz of 4 fever/chills Eyes: No visual changes. ENT: No sore throat. Cardiovascular: Denies chest pain. Respiratory: Denies shortness of breath. Gastrointestinal: No abdominal pain.  No nausea, no vomiting.  No diarrhea.  No constipation. Genitourinary: Negative for dysuria. Musculoskeletal: Negative for back pain. Skin: Negative for rash. Neurological: Negative for headaches, focal weakness or numbness.  10-point ROS otherwise negative.  ____________________________________________   PHYSICAL EXAM:  VITAL SIGNS: ED Triage Vitals  Enc Vitals Group     BP 02/17/16 0055 140/69     Pulse Rate 02/17/16 0055 (!) 124     Resp 02/17/16 0055 (!) 22     Temp 02/17/16 0055 (!) 100.7 F (38.2 C)     Temp Source 02/17/16 0055 Rectal     SpO2 02/17/16 0055 92 %     Weight 02/17/16 0056 160 lb (72.6 kg)     Height 02/17/16 0056 5\' 6"  (1.676 m)     Head Circumference --      Peak Flow --      Pain Score --      Pain Loc --      Pain Edu? --  Excl. in GC? --     Constitutional: Alert  Eyes: Conjunctivae are normal. PERRL. EOMI. Head: Atraumatic. Mouth/Throat: Mucous membranes are moist.  Oropharynx non-erythematous. Neck: No stridor.   Cardiovascular: Normal rate, regular rhythm. Good peripheral circulation. Grossly normal heart sounds. Respiratory: Normal respiratory effort.  No retractions. Right lower lobe rhonchi  Gastrointestinal: Soft and nontender. No distention.  Musculoskeletal: No lower extremity tenderness nor edema. No gross deformities of extremities. Neurologic:  Normal speech and language. No gross focal neurologic deficits are appreciated.  Skin:  Skin is warm, dry and intact. No rash  noted.   ____________________________________________   LABS (all labs ordered are listed, but only abnormal results are displayed)  Labs Reviewed  BASIC METABOLIC PANEL - Abnormal; Notable for the following:       Result Value   Sodium 134 (*)    Chloride 99 (*)    Glucose, Bld 146 (*)    Creatinine, Ser 0.41 (*)    Calcium 8.5 (*)    All other components within normal limits  CBC - Abnormal; Notable for the following:    Hemoglobin 10.6 (*)    HCT 32.5 (*)    RDW 16.7 (*)    All other components within normal limits  URINALYSIS, COMPLETE (UACMP) WITH MICROSCOPIC - Abnormal; Notable for the following:    Color, Urine YELLOW (*)    APPearance CLEAR (*)    Squamous Epithelial / LPF 0-5 (*)    All other components within normal limits  RAPID INFLUENZA A&B ANTIGENS ([facility] ONLY)  TROPONIN I   ____________________________________________  EKG  ED ECG REPORT I, Dent N Blaize Epple, the attending physician, personally viewed and interpreted this ECG.   Date: 02/17/2016  EKG Time: 12:57 AM  Rate: 123  Rhythm: Sinus tachycardia  Axis: Normal  Intervals: Normal  ST&T Change: None ____________________________________________  RADIOLOGY I, Ch[place] N Ariza Evans, personally viewed and evaluated these images (plain radiographs) as part of my medical decision making, as well as reviewing the written report by the radiologist.  Dg Chest Port 1 View  Result Date: 02/17/2016 CLINICAL DATA:  Fever and chest pain.  Nonsmoker. EXAM: PORTABLE CHEST 1 VIEW COMPARISON:  12/21/2015 FINDINGS: Infiltration in the right lung base with focal masslike opacity. This is all new since previous study. Short interval development suggestive this is likely due to pneumonia rather than mass lesion. Atelectasis in the left lung base. Heart size and pulmonary vascularity are normal. No blunting of costophrenic angles. No pneumothorax. Mediastinal contours appear intact. IMPRESSION: Right lower lung opacity is new  since previous study, likely representing pneumonia. Followup PA and lateral chest X-ray is recommended in 3-4 weeks following appropriate clinical therapy to ensure resolution and exclude underlying malignancy. Electronically Signed   By: Burman Nieves M.D.   On: 02/17/2016 01:45     Procedures   Critical Care performed: CRITICAL CARE Performed by: Darci Current   Total critical care time: 45 minutes  Critical care time was exclusive of separately billable procedures and treating other patients.  Critical care was necessary to treat or prevent imminent or life-threatening deterioration.  Critical care was time spent personally by me on the following activities: development of treatment plan with patient and/or surrogate as well as nursing, discussions with consultants, evaluation of patient's response to treatment, examination of patient, obtaining history from patient or surrogate, ordering and performing treatments and interventions, ordering and review of laboratory studies, ordering and review of radiographic studies, pulse oximetry and re-evaluation of patient's condition.;  _____   INITIAL IMPRESSION / ASSESSMENT AND PLAN / ED COURSE  Pertinent labs & imaging results that were available during my care of the patient were reviewed by me and considered in my medical decision making (see chart for details).  History physical exam concerning for possible sepsis given tachycardia fever right lung rhonchi. Chest x-ray revealed right lung pneumonia. Patient given vancomycin and Zosyn in the emergency department. Patient discussed with Dr. Sheryle Haildiamond for hospital admission for further evaluation and management. Clinical Course     ____________________________________________  FINAL CLINICAL IMPRESSION(S) / ED DIAGNOSES  Final diagnoses:  Sepsis, due to unspecified organism (HCC)  HCAP (healthcare-associated pneumonia)     MEDICATIONS GIVEN DURING THIS VISIT:  Medications   vancomycin (VANCOCIN) IVPB 1000 mg/200 mL premix (not administered)  piperacillin-tazobactam (ZOSYN) IVPB 3.375 g (not administered)     NEW OUTPATIENT MEDICATIONS STARTED DURING THIS VISIT:  New Prescriptions   No medications on file    Modified Medications   No medications on file    Discontinued Medications   No medications on file     Note:  This document was prepared using Dragon voice recognition software and may include unintentional dictation errors."    Darci Currentandolph N D'Arcy Abraha, MD 02/17/16 [phone]

## 2016-02-17 NOTE — ED Notes (Signed)
Patient ate grits and potatoes for breakfast, required spoon feeding.

## 2016-02-17 NOTE — ED Notes (Signed)
Pt placed on bedpan,

## 2016-02-17 NOTE — ED Notes (Signed)
Patient bathed with bath in bag.  Moved to hospital bed.

## 2016-02-17 NOTE — ED Notes (Signed)
Family at bedside. 

## 2016-02-17 NOTE — ED Notes (Signed)
Bolus and vancomycin completed.

## 2016-02-17 NOTE — ED Triage Notes (Signed)
Pt presents to ED via EMS from [facility] with c/o possible chest pain. Staff told EMS that pt was pointing to her chest and c/o pain. Hx of dementia. Pt not answering questions at this time. No increased work of breathing noted. Pt supposed to be wearing O2 via nasal cannula but refuses to keep it on her face.

## 2016-02-18 DIAGNOSIS — R000 Tachycardia, unspecified: Secondary | ICD-10-CM | POA: Diagnosis not present

## 2016-02-18 DIAGNOSIS — A419 Sepsis, unspecified organism: Secondary | ICD-10-CM | POA: Diagnosis not present

## 2016-02-18 LAB — HEMOGLOBIN A1C
Hgb A1c MFr Bld: 6.3 % — ABNORMAL HIGH (ref 4.8–5.6)
Mean Plasma Glucose: 134 mg/dL

## 2016-02-18 LAB — PROCALCITONIN: Procalcitonin: <0.1 ng/mL

## 2016-02-18 MED ORDER — CEFTRIAXONE SODIUM-DEXTROSE 1-3.74 GM-% IV SOLR
1.0000 g | INTRAVENOUS | Status: DC
  Administered 2016-02-18 – 2016-02-22 (×5): 1 g via INTRAVENOUS
  Filled 2016-02-18 (×5): qty 50

## 2016-02-18 NOTE — Progress Notes (Signed)
[facility] - [place] at [facility]   PATIENT NAME: Angie GrebeMary Larsen    Larsen#:  [id number]  DATE OF BIRTH:  07/10/1933  SUBJECTIVE:  CHIEF COMPLAINT:  Patient is resting comfortably n demented 2 sisters at bedside  REVIEW OF SYSTEMS:  ROS Unobtainable  DRUG ALLERGIES:   Allergies  Allergen Reactions  . Haldol [Haloperidol Lactate] Hives    VITALS:  Blood pressure (!) 143/53, pulse 71, temperature 97.6 F (36.4 C), resp. rate 18, height 5\' 6"  (1.676 m), weight 71.2 kg (156 lb 14.4 oz), SpO2 97 %.  PHYSICAL EXAMINATION:  GENERAL:  83 y.o.-year-old patient lying in the bed with no acute distress.  EYES: Pupils equal, round, reactive to light and accommodation. No scleral icterus. Extraocular muscles intact.  HEENT: Head atraumatic, normocephalic. Oropharynx and nasopharynx clear.  NECK:  Supple, no jugular venous distention. No thyroid enlargement, no tenderness.  LUNGS: breath Diminished sounds bilaterally, no wheezing, rales,rhonchi or crepitation. No use of accessory muscles of respiration.  CARDIOVASCULAR: S1, S2 normal. No murmurs, rubs, or gallops.  ABDOMEN: Soft, nontender, nondistended. Bowel sounds present. No organomegaly or mass.  EXTREMITIES: No pedal edema, cyanosis, or clubbing.  NEUROLOGIC: Cranial nerves II through XII are intact. Muscle strength 5/5 in all extremities. Sensation intact. Gait not checked.  PSYCHIATRIC: The patient is alert but chronically demented  SKIN: No obvious rash, lesion, or ulcer.    LABORATORY PANEL:   CBC  Recent Labs Lab 02/17/16 0049  WBC 11.0  HGB 10.6*  HCT 32.5*  PLT 328   ------------------------------------------------------------------------------------------------------------------  Chemistries   Recent Labs Lab 02/17/16 0049  NA 134*  K 3.9  CL 99*  CO2 28  GLUCOSE 146*  BUN 12  CREATININE 0.41*  CALCIUM 8.5*    ------------------------------------------------------------------------------------------------------------------  Cardiac Enzymes  Recent Labs Lab 02/17/16 0049  TROPONINI <0.03   ------------------------------------------------------------------------------------------------------------------  RADIOLOGY:  Dg Chest Port 1 View  Result Date: 02/17/2016 CLINICAL DATA:  Fever and chest pain.  Nonsmoker. EXAM: PORTABLE CHEST 1 VIEW COMPARISON:  12/21/2015 FINDINGS: Infiltration in the right lung base with focal masslike opacity. This is all new since previous study. Short interval development suggestive this is likely due to pneumonia rather than mass lesion. Atelectasis in the left lung base. Heart size and pulmonary vascularity are normal. No blunting of costophrenic angles. No pneumothorax. Mediastinal contours appear intact. IMPRESSION: Right lower lung opacity is new since previous study, likely representing pneumonia. Followup PA and lateral chest X-ray is recommended in 3-4 weeks following appropriate clinical therapy to ensure resolution and exclude underlying malignancy. Electronically Signed   By: Burman NievesWilliam  Stevens M.D.   On: 02/17/2016 01:45    EKG:   Orders placed or performed during the hospital encounter of 02/17/16  . ED EKG  . ED EKG  . EKG 12-Lead  . EKG 12-Lead    ASSESSMENT AND PLAN:   83 year old female presenting with fevers   1. Sepsis: Secondary to pneumonia  The patient's meets septic criteria via fever, tachycardia and tachypnea.  She is hemodynamically stable Initially patient is started on Zosyn and vancomycin. Vancomycin discontinued as MRSA PCR is negative Clinically improving. Blood cultures are negative 2 days Change IV Zosyn and by IV Rocephin. Continue azithromycin .  2. Healthcare associated pneumonia: Zosyn changed to IV Rocephin and continue azithromycin  3. Dementia: Continue Paxil and Depakote for mood stabilization  4. Hypertension:  Controlled; continue metoprolol  5. Hypothyroidism: Continue Synthroid.  6. Dyslipidemia: Continue gemfibrozil as well as statin therapy  7. Failure to thrive-with a 20 pounds weight loss in the past 6 weeks could be from acute illness Consult dietitian Family doesn't want any aggressive workup as the patient is chronically demented and 83 year old  . DVT prophylaxis: Lovenox  . GI prophylaxis: None    All the records are reviewed and case discussed with Care Management/Social Workerr. Management plans discussed with the patient, family and they are in agreement.  CODE STATUS: FC   TOTAL TIME TAKING CARE OF THIS PATIENT: 35  minutes.   POSSIBLE D/C IN 1-2  DAYS, DEPENDING ON CLINICAL CONDITION.  Note: This dictation was prepared with Dragon dictation along with smaller phrase technology. Any transcriptional errors that result from this process are unintentional.   Ramonita Lab M.D on 02/18/2016 at 5:09 PM  Between 7am to 6pm - Pager - [phone] After 6pm go to [email or web address] - password [email or web address]  [facility]  Office  [phone]  CC: Primary care physician; Pcp Not In System

## 2016-02-18 NOTE — NC FL2 (Signed)
[place] MEDICAID FL2 LEVEL OF CARE SCREENING TOOL     IDENTIFICATION  Patient Name: Angie Larsen Birthdate: 28-Aug-1933 Sex: female Admission Date (Current Location): 02/17/2016  [facility] and [place] Number:  [address]  ([id number] [facility]) Facility and Address:  [facility], [address], [place], [place] [address]      Provider Number: [id number]  Attending Physician Name and Address:  Ramonita Lab, MD  Relative Name and Phone Number:       Current Level of Care: Hospital Recommended Level of Care: Assisted Living Facility, Memory Care Prior Approval Number:    Date Approved/Denied:   PASRR Number:  ( [id number] )  Discharge Plan: Domiciliary (Rest home) (Memory Care )    Current Diagnoses: Patient Active Problem List   Diagnosis Date Noted  . Sepsis (HCC) 02/17/2016    Orientation RESPIRATION BLADDER Height & Weight      (Disoriented X4)  O2, Normal (Patient has PRN oxygen. ) Incontinent Weight: 156 lb 14.4 oz (71.2 kg) Height:  5\' 6"  (167.6 cm)  BEHAVIORAL SYMPTOMS/MOOD NEUROLOGICAL BOWEL NUTRITION STATUS   (none)  (none) Incontinent Diet (NPO to be advanced. )  AMBULATORY STATUS COMMUNICATION OF NEEDS Skin   Limited Assist Verbally Normal                       Personal Care Assistance Level of Assistance  Bathing, Feeding, Dressing Bathing Assistance: Limited assistance Feeding assistance: Independent Dressing Assistance: Limited assistance     Functional Limitations Info  Sight, Hearing, Speech Sight Info: Adequate Hearing Info: Adequate Speech Info: Adequate    SPECIAL CARE FACTORS FREQUENCY                       Contractures      Additional Factors Info  Code Status, Allergies Code Status Info:  (Full Code. ) Allergies Info:  (Haldol Haloperidol Lactate)           Current Medications (02/18/2016):  This is the current hospital active medication list Current Facility-Administered Medications   Medication Dose Route Frequency Provider Last Rate Last Dose  . 0.9 %  sodium chloride infusion   Intravenous Continuous Arnaldo Natal, MD 100 mL/hr at 02/18/16 0558    . acetaminophen (TYLENOL) tablet 650 mg  650 mg Oral Q6H PRN Arnaldo Natal, MD   650 mg at 02/17/16 1335   Or  . acetaminophen (TYLENOL) suppository 650 mg  650 mg Rectal Q6H PRN Arnaldo Natal, MD      . azithromycin [facility]) tablet 500 mg  500 mg Oral Daily Milagros Loll, MD   500 mg at 02/17/16 1635  . docusate sodium (COLACE) capsule 100 mg  100 mg Oral BID Arnaldo Natal, MD   100 mg at 02/17/16 2222  . enoxaparin (LOVENOX) injection 40 mg  40 mg Subcutaneous Q24H Arnaldo Natal, MD   40 mg at 02/18/16 0558  . levothyroxine (SYNTHROID, LEVOTHROID) tablet 100 mcg  100 mcg Oral QAC breakfast Arnaldo Natal, MD   100 mcg at 02/18/16 0558  . metoprolol (LOPRESSOR) tablet 50 mg  50 mg Oral BID Arnaldo Natal, MD   50 mg at 02/17/16 2222  . ondansetron (ZOFRAN) tablet 4 mg  4 mg Oral Q6H PRN Arnaldo Natal, MD       Or  . ondansetron [facility]) injection 4 mg  4 mg Intravenous Q6H PRN Arnaldo Natal, MD      .  piperacillin-tazobactam (ZOSYN) IVPB 3.375 g  3.375 g Intravenous Q8H Srikar Sudini, MD 12.5 mL/hr at 02/18/16 0321 3.375 g at 02/18/16 0321  . sodium chloride flush (NS) 0.9 % injection 3 mL  3 mL Intravenous Q12H Arnaldo NatalMichael S Diamond, MD   3 mL at 02/17/16 2223  . traMADol (ULTRAM) tablet 50 mg  50 mg Oral Q6H PRN Milagros LollSrikar Sudini, MD   50 mg at 02/17/16 2222     Discharge Medications: Please see discharge summary for a list of discharge medications.  Relevant Imaging Results:  Relevant Lab Results:   Additional Information  (SSN: [id number])  Sample, Darleen CrockerBailey M, LCSW

## 2016-02-18 NOTE — Progress Notes (Signed)
Took over pt care at 1730, pt alert, oriented to self, no complaints, no distress or discomfort noted

## 2016-02-18 NOTE — Clinical Social Work Note (Signed)
Clinical Social Work Assessment  Patient Details  Name: Angie Larsen MRN: [id number] Date of Birth: 1933-02-22  Date of referral:  02/18/16               Reason for consult:  Other (Comment Required)                Permission sought to share information with:  Facility Industrial/product designerContact Representative Permission granted to share information::  Yes, Verbal Permission Granted  Name::      Angie Larsen   Agency::     Relationship::     Contact Information:     Housing/Transportation Living arrangements for the past 2 months:  Assisted Living Facility Source of Information:  Facility, Guardian Patient Interpreter Needed:  None Criminal Activity/Legal Involvement Pertinent to Current Situation/Hospitalization:  No - Comment as needed Significant Relationships:  Siblings Lives with:  Facility Resident Do you feel safe going back to the place where you live?    Need for family participation in patient Larsen:  Yes (Comment)  Larsen giving concerns:  Patient is a long term Larsen resident at [facility] (fax: [phone]).    Social Worker assessment / plan:  Visual merchandiserClinical Social Worker (CSW) received consult that patient is from [facility]. CSW contacted [facility] and spoke to Angie Larsen Memory Larsen Director. Per Angie Larsen patient uses a walker sometimes but primarily uses a wheel chair. Per Angie Larsen patient is on PRN oxygen and can return when stable. Per chart patient is not alert and oriented. CSW contacted patient's sister Angie Larsen who reported that she is patient's guardian. Per sister patient [facility] and she would like for her to return there. CSW will continue to follow and assist as needed.   Employment status:  Retired Health and safety inspectornsurance information:  [place], [employer] PT Recommendations:  Not assessed at this time Information / Referral to community resources:  Other (Comment Required) ([facility] will [facility]en) )  Patient/Family's Response to Larsen:  Patient's sister prefers for patient to return to [facility].   Patient/Family's Understanding of and Emotional Response to Diagnosis, Current Treatment, and Prognosis:  Patient's sister was very pleasant and thanked CSW for calling.   Emotional Assessment Appearance:  Appears stated age Attitude/Demeanor/Rapport:  Unable to Assess Affect (typically observed):  Unable to Assess Orientation:  Oriented to Self, Fluctuating Orientation (Suspected and/or reported Sundowners) Alcohol / Substance use:  Not Applicable Psych involvement (Current and /or in the community):  No (Comment)  Discharge Needs  Concerns to be addressed:  Discharge Planning Concerns Readmission within the last 30 days:  No Current discharge risk:  Dependent with Mobility, Cognitively Impaired, Chronically ill Barriers to Discharge:  Continued Medical Work up   [employer], Angie CrockerBailey M, LCSW 02/18/2016, 4:57 PM

## 2016-02-19 DIAGNOSIS — A419 Sepsis, unspecified organism: Secondary | ICD-10-CM | POA: Diagnosis not present

## 2016-02-19 DIAGNOSIS — R000 Tachycardia, unspecified: Secondary | ICD-10-CM | POA: Diagnosis not present

## 2016-02-19 NOTE — Progress Notes (Signed)
[facility] -  at [facility]   PATIENT NAME: Angie Larsen    MR#:  [id number]  DATE OF BIRTH:  [birth year]  SUBJECTIVE:  CHIEF COMPLAINT:  Patient is resting comfortably n demented ,Today patient is more lethargic but arousable  REVIEW OF SYSTEMS:  ROS Unobtainable  DRUG ALLERGIES:   Allergies  Allergen Reactions  . Haldol [Haloperidol Lactate] Hives    VITALS:  Blood pressure (!) 139/56, pulse 77, temperature 98.7 F (37.1 C), temperature source Oral, resp. rate 16, height 5\' 6"  (1.676 m), weight 69.9 kg (154 lb), SpO2 95 %.  PHYSICAL EXAMINATION:  GENERAL:  83 y.o.-year-old patient lying in the bed with no acute distress.  EYES: Pupils equal, round, reactive to light and accommodation. No scleral icterus. Extraocular muscles intact.  HEENT: Head atraumatic, normocephalic. Oropharynx and nasopharynx clear.  NECK:  Supple, no jugular venous distention. No thyroid enlargement, no tenderness.  LUNGS:  Diminished sounds bilaterally, no wheezing, rales,rhonchi or crepitation. No use of accessory muscles of respiration.  CARDIOVASCULAR: S1, S2 normal. No murmurs, rubs, or gallops.  ABDOMEN: Soft, nontender, nondistended. Bowel sounds present. No organomegaly or mass.  EXTREMITIES: No pedal edema, cyanosis, or clubbing.  NEUROLOGIC: Patient is alert but chronically demented spontaneously moving extremities PSYCHIATRIC: The patient is alert but chronically demented  SKIN: No obvious rash, lesion, or ulcer.    LABORATORY PANEL:   CBC  Recent Labs Lab 02/17/16 0049  WBC 11.0  HGB 10.6*  HCT 32.5*  PLT 328   ------------------------------------------------------------------------------------------------------------------  Chemistries   Recent Labs Lab 02/17/16 0049  NA 134*  K 3.9  CL 99*  CO2 28  GLUCOSE 146*  BUN 12  CREATININE 0.41*  CALCIUM 8.5*    ------------------------------------------------------------------------------------------------------------------  Cardiac Enzymes  Recent Labs Lab 02/17/16 0049  TROPONINI <0.03   ------------------------------------------------------------------------------------------------------------------  RADIOLOGY:  No results found.  EKG:   Orders placed or performed during the hospital encounter of 02/17/16  . ED EKG  . ED EKG  . EKG 12-Lead  . EKG 12-Lead    ASSESSMENT AND PLAN:   83 year old female presenting with fevers   1. Sepsis: Secondary to pneumonia  The patient's meets septic criteria via fever, tachycardia and tachypnea.  She is hemodynamically stable Initially patient is started on Zosyn and vancomycin. Vancomycin discontinued as MRSA PCR is negative Clinically improving. Blood cultures are negative 2 days Change IV Zosyn to IV Rocephin. Continue azithromycin Await clinical improvement .  2. Healthcare associated pneumonia: Zosyn changed to IV Rocephin and continue azithromycin  3. Dementia: Continue Paxil and Depakote for mood stabilization  4. Hypertension: Controlled; continue metoprolol  5. Hypothyroidism: Continue Synthroid.  6. Dyslipidemia: Continue gemfibrozil as well as statin therapy  7. Failure to thrive-with a 20 pounds weight loss in the past 6 weeks could be from acute illness F/u with dietitian Family doesn't want any aggressive workup as the patient is chronically demented and 83 year old  . DVT prophylaxis: Lovenox  . GI prophylaxis: None    All the records are reviewed and case discussed with Care Management/Social Workerr. Management plans discussed with the patient, family and they are in agreement.  CODE STATUS: FC   TOTAL TIME TAKING CARE OF THIS PATIENT: 35  minutes.   POSSIBLE D/C IN 1-2  DAYS, DEPENDING ON CLINICAL CONDITION.  Note: This dictation was prepared with Dragon dictation along with smaller phrase  technology. Any transcriptional errors that result from this process are unintentional.   Ramonita Lab M.D on 02/19/2016 at  2:41 PM  Between 7am to 6pm - Pager - [phone] After 6pm go to [email or web address] - password [email or web address]  [facility] [place] [facility]  Office  [phone]  CC: Primary care physician; Pcp Not In System

## 2016-02-20 ENCOUNTER — Inpatient Hospital Stay: Payer: Medicare Other

## 2016-02-20 DIAGNOSIS — A419 Sepsis, unspecified organism: Secondary | ICD-10-CM | POA: Diagnosis not present

## 2016-02-20 DIAGNOSIS — R000 Tachycardia, unspecified: Secondary | ICD-10-CM | POA: Diagnosis not present

## 2016-02-20 LAB — PROCALCITONIN: Procalcitonin: <0.1 ng/mL

## 2016-02-20 NOTE — Progress Notes (Signed)
Initial Nutrition Assessment  DOCUMENTATION CODES:   Non-severe (moderate) malnutrition in context of chronic illness  INTERVENTION:  1. Ensure Enlive po BID, each supplement provides 350 kcal and 20 grams of protein 2. Family reports 20# wt loss over 6 weeks but does not want aggressive care. If she does not consume oral nutrition supplements and appetite does not improve, recommend attempt appetite stimulant such as Megace.   NUTRITION DIAGNOSIS:   Malnutrition related to chronic illness as evidenced by moderate depletions of muscle mass, moderate depletion of body fat.  GOAL:   Patient will meet greater than or equal to 90% of their needs  MONITOR:   PO intake, I & O's, Labs, Weight trends, Supplement acceptance  REASON FOR ASSESSMENT:   Consult Assessment of nutrition requirement/status  ASSESSMENT:   The patient with past medical history of dementia and hypertension presents to the emergency department from her nursing home due to tachycardia.  Spoke with patient at bedside, no family available. She is pleasantly demented, unable to provide any history. Nutrition-Focused physical exam completed. Findings are moderate-severe fat depletion, moderate muscle depletion, and no edema.  Per chart she exhibits a 6#/3.75% insignificant wt loss over 6 months. Family reports a 20# wt loss over 6 weeks but does not want aggressive care. Could find no evidence of this in chart, but would indicate a 20#/11.5% severe wt loss. Documented meal completion thus far: 0-35% over 6 meals. Had a tray from this morning's breakfast that she ate very little of.  Labs and medications reviewed: Colace, Synthroid NS @ 1800mL/hr  Diet Order:  Diet Heart Room service appropriate? Yes; Fluid consistency: Thin  Skin:  Reviewed, no issues  Last BM:  02/19/2016  Height:   Ht Readings from Last 1 Encounters:  02/17/16 5\' 6"  (1.676 m)    Weight:   Wt Readings from Last 1 Encounters:  02/20/16  154 lb 1.6 oz (69.9 kg)    Ideal Body Weight:  59.09 kg  BMI:  Body mass index is 24.87 kg/m.  Estimated Nutritional Needs:   Kcal:  1310-1490 calories  Protein:  70-84 gm  Fluid:  >/= 1.3L  EDUCATION NEEDS:   No education needs identified at this time  Dionne AnoWilliam M. Santiel Topper, MS, RD LDN Inpatient Clinical Dietitian Pager [phone]

## 2016-02-20 NOTE — Progress Notes (Signed)
[facility] - [place] at [facility]   PATIENT NAME: Angie Larsen    MR#:  [id number]  DATE OF BIRTH:  February 08, 1933  SUBJECTIVE:  CHIEF COMPLAINT:  Patient is resting comfortably n demented . No family members at bedside  REVIEW OF SYSTEMS:  ROS Unobtainable  DRUG ALLERGIES:   Allergies  Allergen Reactions  . Haldol [Haloperidol Lactate] Hives    VITALS:  Blood pressure (!) 157/77, pulse 88, temperature 97.8 F (36.6 C), resp. rate 20, height 5\' 6"  (1.676 m), weight 69.9 kg (154 lb 1.6 oz), SpO2 100 %.  PHYSICAL EXAMINATION:  GENERAL:  83 y.o.-year-old patient lying in the bed with no acute distress.  EYES: Pupils equal, round, reactive to light and accommodation. No scleral icterus. Extraocular muscles intact.  HEENT: Head atraumatic, normocephalic. Oropharynx and nasopharynx clear.  NECK:  Supple, no jugular venous distention. No thyroid enlargement, no tenderness.  LUNGS:  Diminished sounds bilaterally,Positive crackles no wheezing, rales,rhonchi or crepitation. No use of accessory muscles of respiration.  CARDIOVASCULAR: S1, S2 normal. No murmurs, rubs, or gallops.  ABDOMEN: Soft, nontender, nondistended. Bowel sounds present. No organomegaly or mass.  EXTREMITIES: No pedal edema, cyanosis, or clubbing.  NEUROLOGIC: Patient is alert but chronically demented spontaneously moving extremities PSYCHIATRIC: The patient is alert but chronically demented  SKIN: No obvious rash, lesion, or ulcer.    LABORATORY PANEL:   CBC  Recent Labs Lab 02/17/16 0049  WBC 11.0  HGB 10.6*  HCT 32.5*  PLT 328   ------------------------------------------------------------------------------------------------------------------  Chemistries   Recent Labs Lab 02/17/16 0049  NA 134*  K 3.9  CL 99*  CO2 28  GLUCOSE 146*  BUN 12  CREATININE 0.41*  CALCIUM 8.5*    ------------------------------------------------------------------------------------------------------------------  Cardiac Enzymes  Recent Labs Lab 02/17/16 0049  TROPONINI <0.03   ------------------------------------------------------------------------------------------------------------------  RADIOLOGY:  No results found.  EKG:   Orders placed or performed during the hospital encounter of 02/17/16  . ED EKG  . ED EKG  . EKG 12-Lead  . EKG 12-Lead    ASSESSMENT AND PLAN:   83 year old female presenting with fevers   1. Sepsis: Secondary to pneumonia  The patient's meets septic criteria via fever, tachycardia and tachypnea.  She is hemodynamically stable We will get a chest x-ray for follow-up Initially patient is started on Zosyn and vancomycin. Vancomycin discontinued as MRSA PCR is negative Clinically improving. Blood cultures are negative 2 days Change IV Zosyn to IV Rocephin. Continue azithromycin Await clinical improvement .  2. Healthcare associated pneumonia: Zosyn changed to IV Rocephin and continue azithromycin  3. Dementia: Continue Paxil and Depakote for mood stabilization  4. Hypertension: Controlled; continue metoprolol  5. Hypothyroidism: Continue Synthroid.  6. Dyslipidemia: Continue gemfibrozil as well as statin therapy  7. Failure to thrive-with a 20 pounds weight loss in the past 6 weeks could be from acute illness F/u with dietitian Family doesn't want any aggressive workup as the patient is chronically demented and 83 year old  . DVT prophylaxis: Lovenox  . GI prophylaxis: None  Consult physical therapy  All the records are reviewed and case discussed with Care Management/Social Workerr. Management plans discussed with the patient, family and they are in agreement.  CODE STATUS: FC   TOTAL TIME TAKING CARE OF THIS PATIENT: 35  minutes.   POSSIBLE D/C IN 1-2  DAYS, DEPENDING ON CLINICAL CONDITION.  Note: This dictation was  prepared with Dragon dictation along with smaller phrase technology. Any transcriptional errors that result from this process are  unintentional.   Ramonita LabGouru, Aydrien Froman M.D on 02/20/2016 at 11:49 AM  Between 7am to 6pm - Pager - [phone] After 6pm go to [email or web address] - password [email or web address]  [facility] [place] [facility]  Office  [phone]  CC: Primary care physician; Pcp Not In System

## 2016-02-21 DIAGNOSIS — A419 Sepsis, unspecified organism: Secondary | ICD-10-CM | POA: Diagnosis not present

## 2016-02-21 DIAGNOSIS — R000 Tachycardia, unspecified: Secondary | ICD-10-CM | POA: Diagnosis not present

## 2016-02-21 LAB — CBC
HEMATOCRIT: 33.5 % — AB (ref 35.0–47.0)
Hemoglobin: 11.3 g/dL — ABNORMAL LOW (ref 12.0–16.0)
MCH: 27.7 pg (ref 26.0–34.0)
MCHC: 33.8 g/dL (ref 32.0–36.0)
MCV: 81.8 fL (ref 80.0–100.0)
PLATELETS: 333 10*3/uL (ref 150–440)
RBC: 4.1 MIL/uL (ref 3.80–5.20)
RDW: 16.4 % — AB (ref 11.5–14.5)
WBC: 7.7 10*3/uL (ref 3.6–11.0)

## 2016-02-21 LAB — GLUCOSE, CAPILLARY: Glucose-Capillary: 107 mg/dL — ABNORMAL HIGH (ref 65–99)

## 2016-02-21 LAB — CREATININE, SERUM: CREATININE: 0.35 mg/dL — AB (ref 0.44–1.00)

## 2016-02-21 NOTE — Care Management Important Message (Signed)
Important Message  Patient Details  Name: Tilda FrancoMary L Bouie MRN: [id number] Date of Birth: 03-May-1933   Medicare Important Message Given:  Yes    Gwenette GreetBrenda S Talia Hoheisel, RN 02/21/2016, 10:20 AM

## 2016-02-21 NOTE — Progress Notes (Signed)
[facility] will not be able to accept patient back because they stated she needs a higher level of care. Clinical Child psychotherapistocial Worker (CSW) started SNF bed search with patient's sister/ guardian Elsie's permission. PASARR is pending.  CSW presented bed offers to sister and she chose [facility]. CSW [facility] lia[facility] a message masking him aware of accepted bed offer and asking him to start [facility].   [employer]Bailey Beryle Bagsby, LCSW [phone]

## 2016-02-21 NOTE — Progress Notes (Signed)
[facility] [place] [facility]   PATIENT NAME: Angie GrebeMary Larsen    MR#:  [id number]  DATE OF BIRTH:  1933-11-07  SUBJECTIVE:  CHIEF COMPLAINT:  Patient is resting comfortably n demented .No over night events  No family members at bedside  REVIEW OF SYSTEMS:  ROS Unobtainable  DRUG ALLERGIES:   Allergies  Allergen Reactions  . Haldol [Haloperidol Lactate] Hives    VITALS:  Blood pressure (!) 144/64, pulse 90, temperature 98.7 F (37.1 C), temperature source Axillary, resp. rate 20, height 5\' 6"  (1.676 m), weight 68.2 kg (150 lb 6.4 oz), SpO2 98 %.  PHYSICAL EXAMINATION:  GENERAL:  83 y.o.-year-old patient lying in the bed with no acute distress.  EYES: Pupils equal, round, reactive to light and accommodation. No scleral icterus. Extraocular muscles intact.  HEENT: Head atraumatic, normocephalic. Oropharynx and nasopharynx clear.  NECK:  Supple, no jugular venous distention. No thyroid enlargement, no tenderness.  LUNGS:  Diminished sounds bilaterally,Positive crackles no wheezing, rales,rhonchi or crepitation. No use of accessory muscles of respiration.  CARDIOVASCULAR: S1, S2 normal. No murmurs, rubs, or gallops.  ABDOMEN: Soft, nontender, nondistended. Bowel sounds present. No organomegaly or mass.  EXTREMITIES: No pedal edema, cyanosis, or clubbing.  NEUROLOGIC: Patient is alert but chronically demented spontaneously moving extremities PSYCHIATRIC: The patient is alert but chronically demented  SKIN: No obvious rash, lesion, or ulcer.    LABORATORY PANEL:   CBC  Recent Labs Lab 02/21/16 0740  WBC 7.7  HGB 11.3*  HCT 33.5*  PLT 333   ------------------------------------------------------------------------------------------------------------------  Chemistries   Recent Labs Lab 02/17/16 0049 02/21/16 0740  NA 134*  --   K 3.9  --   CL 99*  --   CO2 28  --   GLUCOSE 146*  --   BUN 12  --   CREATININE 0.41* 0.35*  CALCIUM 8.5*   --    ------------------------------------------------------------------------------------------------------------------  Cardiac Enzymes  Recent Labs Lab 02/17/16 0049  TROPONINI <0.03   ------------------------------------------------------------------------------------------------------------------  RADIOLOGY:  Dg Chest 2 View  Result Date: 02/20/2016 CLINICAL DATA:  Shortness of breath. EXAM: CHEST  2 VIEW COMPARISON:  One-view chest x-ray 02/17/2016. FINDINGS: The heart is enlarged. Persistent right lower lobe pneumonia is present. There is new left lower lobe airspace disease. Bilateral effusions are present, left greater than right. The upper lung fields are clear. Mild osteopenia is present. The visualized soft tissues and bony thorax are otherwise unremarkable. IMPRESSION: 1. Persistent right lower lobe pneumonia. 2. Progressive left lower lobe airspace disease and effusion compatible with pneumonia. Electronically Signed   By: Marin Robertshristopher  Mattern M.D.   On: 02/20/2016 13:08    EKG:   Orders placed or performed during the hospital encounter of 02/17/16  . ED EKG  . ED EKG  . EKG 12-Lead  . EKG 12-Lead    ASSESSMENT AND PLAN:   83 year old female presenting with fevers   1. Sepsis: Secondary to pneumonia  The patient's meets septic criteria via fever, tachycardia and tachypnea.  She is hemodynamically stable We will get a chest x-ray for follow-up Initially patient is started on Zosyn and vancomycin. Vancomycin discontinued as MRSA PCR is negative Clinically improving. Blood cultures are negative 2 days Changed IV Zosyn to IV Rocephin. Continue IV Rocephin and azithromycin Clinically stable. Awaiting placement.  2. Healthcare associated pneumonia: Zosyn changed to IV Rocephin and continue azithromycin  3. Dementia: Continue Paxil and Depakote for mood stabilization  4. Hypertension: Controlled; continue metoprolol  5. Hypothyroidism: Continue  Synthroid.  6.  Dyslipidemia: Continue gemfibrozil as well as statin therapy  7. Failure to thrive-with a 20 pounds weight loss in the past 6 weeks could be from acute illness F/u with dietitian Family doesn't want any aggressive workup as the patient is chronically demented and 83 year old  . DVT prophylaxis: Lovenox  . GI prophylaxis: None   physical therapy-recommending skilled facility. Follow up with case management awaiting bed, placement  All the records are reviewed and case discussed with Care Management/Social Workerr. Management plans discussed with the patient, family and they are in agreement.  CODE STATUS: FC   TOTAL TIME TAKING CARE OF THIS PATIENT: 32  minutes.   POSSIBLE D/C IN 1-2  DAYS, DEPENDING ON CLINICAL CONDITION.  Note: This dictation was prepared with Dragon dictation along with smaller phrase technology. Any transcriptional errors that result from this process are unintentional.   Ramonita Lab M.D on 02/21/2016 at 2:56 PM  Between 7am to 6pm - Pager - [phone] After 6pm go to [email or web address] - password [email or web address]  [facility] Hospital[facility]  Office  [phone]  CC: Primary care physician; Pcp Not In System

## 2016-02-21 NOTE — NC FL2 (Signed)
[place] MEDICAID FL2 LEVEL OF CARE SCREENING TOOL     IDENTIFICATION  Patient Name: Angie Larsen Birthdate: [birth year] Sex: female Admission Date (Current Location): 02/17/2016  [facility] [place]  [place]  ([id number] [facility] Facility and Address:  [facility], [address], [place], [place] [address]      Provider Number: [id number]  Attending Physician Name and Address:  Ramonita LabAruna Kizer Nobbe, MD  Relative Name and Phone Number:       Current Level of Care: Hospital Recommended Level of Care: SNF  Prior Approval Number:    Date Approved/Denied:   PASRR Number:   Discharge Plan: SNF     Current Diagnoses: Patient Active Problem List   Diagnosis Date Noted  . Sepsis (HCC) 02/17/2016    Orientation RESPIRATION BLADDER Height & Weight      (Disoriented X4)  O2, Normal (Patient has PRN oxygen. ) Incontinent Weight: 150 lb 6.4 oz (68.2 kg) Height:  5\' 6"  (167.6 cm)  BEHAVIORAL SYMPTOMS/MOOD NEUROLOGICAL BOWEL NUTRITION STATUS   (none)  (none) Incontinent Diet (NPO to be advanced. )  AMBULATORY STATUS COMMUNICATION OF NEEDS Skin   Extensive Assist  Verbally Normal                       Personal Care Assistance Level of Assistance  Bathing, Feeding, Dressing Bathing Assistance: Limited assistance Feeding assistance: Independent Dressing Assistance: Limited assistance     Functional Limitations Info  Sight, Hearing, Speech Sight Info: Adequate Hearing Info: Adequate Speech Info: Adequate    SPECIAL CARE FACTORS FREQUENCY   PT and OT   5 times per week.                   Contractures      Additional Factors Info  Code Status, Allergies Code Status Info:  (Full Code. ) Allergies Info:  (Haldol Haloperidol Lactate)           Current Medications (02/21/2016):  This is the current hospital active medication list Current Facility-Administered Medications  Medication Dose Route Frequency Provider Last Rate Last Dose   . 0.9 %  sodium chloride infusion   Intravenous Continuous Arnaldo NatalMichael S Diamond, MD 100 mL/hr at 02/21/16 0145    . acetaminophen (TYLENOL) tablet 650 mg  650 mg Oral Q6H PRN Arnaldo NatalMichael S Diamond, MD   650 mg at 02/20/16 2111   Or  . acetaminophen (TYLENOL) suppository 650 mg  650 mg Rectal Q6H PRN Arnaldo NatalMichael S Diamond, MD      . azithromycin [facility](ZITHROMAX) tablet 500 mg  500 mg Oral Daily Milagros LollSrikar Sudini, MD   500 mg at 02/21/16 1044  . cefTRIAXone (ROCEPHIN) IVPB 1 g  1 g Intravenous Q24H Ramonita LabAruna Hansini Clodfelter, MD   1 g at 02/19/16 1710  . docusate sodium (COLACE) capsule 100 mg  100 mg Oral BID Arnaldo NatalMichael S Diamond, MD   100 mg at 02/21/16 1044  . enoxaparin (LOVENOX) injection 40 mg  40 mg Subcutaneous Q24H Arnaldo NatalMichael S Diamond, MD   40 mg at 02/21/16 [phone]  . levothyroxine (SYNTHROID, LEVOTHROID) tablet 100 mcg  100 mcg Oral QAC breakfast Arnaldo NatalMichael S Diamond, MD   100 mcg at 02/21/16 [phone]  . metoprolol (LOPRESSOR) tablet 50 mg  50 mg Oral BID Arnaldo NatalMichael S Diamond, MD   50 mg at 02/21/16 1044  . ondansetron (ZOFRAN) tablet 4 mg  4 mg Oral Q6H PRN Arnaldo NatalMichael S Diamond, MD       Or  . ondansetron (  ZOFRAN) injection 4 mg  4 mg Intravenous Q6H PRN Arnaldo Natal, MD      . sodium chloride flush (NS) 0.9 % injection 3 mL  3 mL Intravenous Q12H Arnaldo Natal, MD   3 mL at 02/19/16 2002  . traMADol (ULTRAM) tablet 50 mg  50 mg Oral Q6H PRN Milagros Loll, MD   50 mg at 02/17/16 2222     Discharge Medications: Please see discharge summary for a list of discharge medications.  Relevant Imaging Results:  Relevant Lab Results:   Additional Information  (SSN: [id number])  Sample, Darleen Crocker, LCSW

## 2016-02-21 NOTE — Progress Notes (Addendum)
[facility] they will have to send someone out to assess her today in order to determine if they can accept her back. Clinical Social Worker (CSW) emphasized that patient has been discharged and the assessment will have to take place ASAP. Per Angie Larsen she will call CSW with a time for assessment.   Angie Larsen came to assess patient. CS[facility] today's PT [facility]. Per staff they will have to speak with their adminstrator to make a final decision. CSW is waiting on a [facility] with final decision.   [employer]iley Allura Doepke, LCSW [phone]

## 2016-02-21 NOTE — Evaluation (Signed)
Physical Therapy Evaluation Patient Details Name: Angie Larsen MRN: [id number] DOB: Apr 05, 1933 Today's Date: 02/21/2016   History of Present Illness  Pt is a 83 y/o F who presented to the ED from [facility] due to tachycardia.  There was concern about potential chest pain as well.  In the ED the pt was found to be febrile and tachypneic, her chest X-ray showed pneumonia, and pt was admitted with sepsis due to pneumonia.  Pt's PMH includes Alzheimer's dementia, anxiety.    Clinical Impression  Pt admitted with above diagnosis. Pt currently with functional limitations due to the deficits listed below (see PT Problem List). Ms. Dame is from [facility] where her sisters report she has +2 assist to stand and pivot to her WC in the morning where she spends most of the day.  Sisters were under the impression she would be receiving therapy to improve independence with transfers but they do not believe this has been the case.  The pt currently requires mod assist for supine>sit and max +2 assist for sit>supine transfers.  She requires max assist to stand due to pt's strong posterior lean and inability to correct this despite verbal and tactile cues.  Given pt's current mobility status recommending pt to go to a facility where pt will receive 24/7 assist/supervision where she will receive therapy.     Follow Up Recommendations SNF;Supervision/Assistance - 24 hour    Equipment Recommendations  None recommended by PT    Recommendations for Other Services       Precautions / Restrictions Precautions Precautions: Fall Restrictions Weight Bearing Restrictions: No      Mobility  Bed Mobility Overal bed mobility: Needs Assistance Bed Mobility: Supine to Sit;Sit to Supine     Supine to sit: Mod assist;HOB elevated Sit to supine: Max assist;+2 for physical assistance   General bed mobility comments: Pt requires mod assist for supine>sit with assist provided to manage BLEs and to elevate  trunk.  Pt wanting to hold onto sister's hand during transition.  Use of bed pad for positoining to sitting EOB.  Pt reuqires +2 max assist to return to supine as she has a difficult time folowing instructions.  Transfers Overall transfer level: Needs assistance Equipment used: 1 person hand held assist Transfers: Sit to/from Stand Sit to Stand: Max assist         General transfer comment: Cues for hand placement and for anterior trunk lean as pt with posterior bias sitting EOB.  Attempted x2 standing from bed with each time pt standing with max assist and demonstrating a strong posterior lean making it not safe to attempt to pivot or to ambulate.  Ambulation/Gait             General Gait Details: not safe to attempt at this time  Stairs            Wheelchair Mobility    Modified Rankin (Stroke Patients Only)       Balance Overall balance assessment: Needs assistance Sitting-balance support: Feet supported;Single extremity supported Sitting balance-Leahy Scale: Poor Sitting balance - Comments: Pt requires at least 1 UE spported while sitting EOB and demonstrates a posterior lean requiring cues for upright.  She sat EOB ~5 minutes with min guard>mod assist to maintain balance. Postural control: Posterior lean Standing balance support: Bilateral upper extremity supported;During functional activity Standing balance-Leahy Scale: Poor Standing balance comment: Pt holding onto bed rail and 1 person HHA in standing with strong posterior lean, pt unable to achieve  anterior weight shift despite verbal and tactile cues                             Pertinent Vitals/Pain Pain Assessment: No/denies pain    Home Living Family/patient expects to be discharged to:: Skilled nursing facility                      Prior Function Level of Independence: Needs assistance   Gait / Transfers Assistance Needed: Per sisters the pt has been receiving +2 assist to stand  and pivot [facility] and to their knowledge [facility]  Sisters report they thought pt was to be receiving PT but they do not think this is the case.  ADL's / Homemaking Assistance Needed: Pt needs assist with all ADLs.        Hand Dominance        Extremity/Trunk Assessment   Upper Extremity Assessment Upper Extremity Assessment: Overall WFL for tasks assessed (Strength grossly 3/5)    Lower Extremity Assessment Lower Extremity Assessment: Overall WFL for tasks assessed (Strength grossly 3/5)    Cervical / Trunk Assessment Cervical / Trunk Assessment: Kyphotic  Communication   Communication: HOH  Cognition Arousal/Alertness: Awake/alert Behavior During Therapy: Anxious Overall Cognitive Status: History of cognitive impairments - at baseline                      General Comments General comments (skin integrity, edema, etc.): Two sisters in room during evaluation    Exercises     Assessment/Plan    PT Assessment Patient needs continued PT services  PT Problem List Decreased strength;Decreased activity tolerance;Decreased balance;Decreased cognition;Decreased knowledge of use of DME;Decreased safety awareness          PT Treatment Interventions DME instruction;Gait training;Functional mobility training;Therapeutic exercise;Therapeutic activities;Balance training;Neuromuscular re-education;Wheelchair mobility training;Cognitive remediation;Patient/family education    PT Goals (Current goals can be found in the Care Plan section)  Acute Rehab PT Goals Patient Stated Goal: none stated by pt, per family the goal is for the pt to receive PT to become more independent PT Goal Formulation: With family Time For Goal Achievement: 03/06/16 Potential to Achieve Goals: Fair    Frequency Min 2X/week   Barriers to discharge   [facility]ure [facility]    Co-evaluation               End of Session Equipment  Utilized During Treatment: Gait belt Activity Tolerance: Patient limited by fatigue Patient left: in bed;with call bell/phone within reach;with bed alarm set;with family/visitor present Nurse Communication: Mobility status         Time: [id number] PT Time Calculation (min) (ACUTE ONLY): 25 min   Charges:   PT Evaluation $PT Eval Low Complexity: 1 Procedure PT Treatments $Therapeutic Activity: 8-22 mins   PT G Codes:        Encarnacion ChuAshley Abashian PT, DPT 02/21/2016, 12:08 PM

## 2016-02-22 DIAGNOSIS — A419 Sepsis, unspecified organism: Secondary | ICD-10-CM | POA: Diagnosis not present

## 2016-02-22 DIAGNOSIS — R000 Tachycardia, unspecified: Secondary | ICD-10-CM | POA: Diagnosis not present

## 2016-02-22 LAB — CULTURE, BLOOD (ROUTINE X 2)
Culture: NO GROWTH
Culture: NO GROWTH

## 2016-02-22 LAB — PROCALCITONIN

## 2016-02-22 MED ORDER — AMOXICILLIN-POT CLAVULANATE 875-125 MG PO TABS
1.0000 | ORAL_TABLET | Freq: Two times a day (BID) | ORAL | Status: DC
  Administered 2016-02-22 – 2016-02-24 (×5): 1 via ORAL
  Filled 2016-02-22 (×5): qty 1

## 2016-02-22 MED ORDER — TRAMADOL HCL 50 MG PO TABS: 50.0000 mg | ORAL_TABLET | Freq: Four times a day (QID) | ORAL | 0 refills | Status: DC | PRN

## 2016-02-22 MED ORDER — SACCHAROMYCES BOULARDII 250 MG PO CAPS: 250.0000 mg | ORAL_CAPSULE | Freq: Two times a day (BID) | ORAL | 0 refills | Status: DC

## 2016-02-22 MED ORDER — AMOXICILLIN-POT CLAVULANATE 875-125 MG PO TABS: 1.0000 | ORAL_TABLET | Freq: Two times a day (BID) | ORAL | 0 refills | Status: DC

## 2016-02-22 NOTE — Progress Notes (Signed)
[facility] [employer] authorization is still pending. Clinical Social Worker (CSW) contacted patient's sister/ guardian [name] is aware of above.   [employer]iley Elison Worrel, LCSW [phone]

## 2016-02-22 NOTE — Progress Notes (Signed)
[facility] - [place] at [facility]   PATIENT NAME: Angie Larsen    MR#:  [id number]  DATE OF BIRTH:  May 21, 1933  SUBJECTIVE:  CHIEF COMPLAINT:  Patient is resting comfortably n demented .No over night events  No family members at bedside  REVIEW OF SYSTEMS:  ROS Unobtainable  DRUG ALLERGIES:   Allergies  Allergen Reactions  . Haldol [Haloperidol Lactate] Hives    VITALS:  Blood pressure (!) 141/61, pulse 72, temperature 98.7 F (37.1 C), temperature source Oral, resp. rate 16, height 5\' 6"  (1.676 m), weight 67.1 kg (147 lb 14.4 oz), SpO2 95 %.  PHYSICAL EXAMINATION:  GENERAL:  83 y.o.-year-old patient lying in the bed with no acute distress.  EYES: Pupils equal, round, reactive to light and accommodation. No scleral icterus. Extraocular muscles intact.  HEENT: Head atraumatic, normocephalic. Oropharynx and nasopharynx clear.  NECK:  Supple, no jugular venous distention. No thyroid enlargement, no tenderness.  LUNGS:  Diminished sounds bilaterally,Positive crackles no wheezing, rales,rhonchi or crepitation. No use of accessory muscles of respiration.  CARDIOVASCULAR: S1, S2 normal. No murmurs, rubs, or gallops.  ABDOMEN: Soft, nontender, nondistended. Bowel sounds present. No organomegaly or mass.  EXTREMITIES: No pedal edema, cyanosis, or clubbing.  NEUROLOGIC: Patient is alert but chronically demented spontaneously moving extremities PSYCHIATRIC: The patient is alert but chronically demented  SKIN: No obvious rash, lesion, or ulcer.    LABORATORY PANEL:   CBC  Recent Labs Lab 02/21/16 0740  WBC 7.7  HGB 11.3*  HCT 33.5*  PLT 333   ------------------------------------------------------------------------------------------------------------------  Chemistries   Recent Labs Lab 02/17/16 0049 02/21/16 0740  NA 134*  --   K 3.9  --   CL 99*  --   CO2 28  --   GLUCOSE 146*  --   BUN 12  --   CREATININE 0.41* 0.35*  CALCIUM 8.5*   --    ------------------------------------------------------------------------------------------------------------------  Cardiac Enzymes  Recent Labs Lab 02/17/16 0049  TROPONINI <0.03   ------------------------------------------------------------------------------------------------------------------  RADIOLOGY:  No results found.  EKG:   Orders placed or performed during the hospital encounter of 02/17/16  . ED EKG  . ED EKG  . EKG 12-Lead  . EKG 12-Lead    ASSESSMENT AND PLAN:   83 year old female presenting with fevers   1. Sepsis: Secondary to pneumonia  The patient's meets septic criteria via fever, tachycardia and tachypnea. She is hemodynamically stable Initially patient is started on Zosyn and vancomycin. Vancomycin discontinued as MRSA PCR is negative Clinically improving. Blood cultures are negative 2 days ChangedIV Zosyn to IV Rocephin. Continue IV Rocephin andazithromycin. We will dc patient with by mouth Augmentin with probiotics for 1 more week .  2. Healthcare associated pneumonia: Zosyn changed to IV Rocephin and continue azithromycin Discharge with by mouth Augmentin. Patient completed azithromycin for 5 days   3. Dementia: Continue Paxil and Depakote for mood stabilization  4. Hypertension: Controlled; continue metoprolol  5. Hypothyroidism: Continue Synthroid.  6. Dyslipidemia: Continue gemfibrozil as well as statin therapy  7. Failure to thrive-with a 20 pounds weight loss in the past 6 weeks could be from acute illness F/u with dietitian Family doesn't want any aggressive workup as the patient is chronically demented and 83 year old  . DVT prophylaxis: Lovenox  . GI prophylaxis: None   physical therapy-recommending skilled facility.  Follow up with case management awaiting bed, placement  All the records are reviewed and case discussed with Care Management/Social Workerr. Management plans discussed with the patient, family  and they  are in agreement.  CODE STATUS: FC   TOTAL TIME TAKING CARE OF THIS PATIENT: 32  minutes.   POSSIBLE D/C IN 1-2  DAYS, DEPENDING ON CLINICAL CONDITION.  Note: This dictation was prepared with Dragon dictation along with smaller phrase technology. Any transcriptional errors that result from this process are unintentional.   Ramonita LabGouru, Berlie Persky M.D on 02/22/2016 at 7:44 PM  Between 7am to 6pm - Pager - [phone] After 6pm go to [email or web address] - password [email or web address]  [facility]  Office  [phone]  CC: Primary care physician; Pcp Not In System

## 2016-02-22 NOTE — Discharge Summary (Signed)
[facility] Physicians - [place] at [facility]   PATIENT NAME: Angie Larsen    MR#:  [id number]  DATE OF BIRTH:  11-25-1933  DATE OF ADMISSION:  02/17/2016 ADMITTING PHYSICIAN: Arnaldo Natal, MD  DATE OF DISCHARGE:02/22/16 PRIMARY CARE PHYSICIAN: Pcp Not In System    ADMISSION DIAGNOSIS:  HCAP (healthcare-associated pneumonia) [J18.9] Sepsis, due to unspecified organism (HCC) [A41.9]  DISCHARGE DIAGNOSIS:  Active Problems:   Sepsis (HCC)   SECONDARY DIAGNOSIS:   Past Medical History:  Diagnosis Date  . Alzheimer's dementia   . Anxiety   . Depression   . GERD (gastroesophageal reflux disease)   . Hypertension     HOSPITAL COURSE:  Chief Complaint: Tachycardia HPI: The patient with past medical history of dementia and hypertension presents to the emergency department from her nursing home due to tachycardia. Apparently the patient made some gestured toward her chest at the time the facility staff was evaluating her which gave them concern for chest pain. The patient does not verbalize this complaint. In the emergency department however she was found to be febrile and tachypneic. Chest x-ray showed multifocal pneumonia. Dr. protocol was initiated and the emergency department staff called the hospital service for admission.Please review history and physical for details  Hospital course  1. Sepsis: Secondary to pneumonia  The patient's meets septic criteria via fever, tachycardia and tachypnea.  She is hemodynamically stable Initially patient is started on Zosyn and vancomycin. Vancomycin discontinued as MRSA PCR is negative Clinically improving. Blood cultures are negative 2 days Changed IV Zosyn to IV Rocephin. Continue IV Rocephin and azithromycin. We will discharge patient with by mouth Augmentin with probiotics for 1 more week .  2. Healthcare associated pneumonia: Zosyn changed to IV Rocephin and continue azithromycin Discharge with by mouth  Augmentin. Patient completed azithromycin for 5 days  3. Dementia: Continue Paxil and Depakote for mood stabilization  4. Hypertension: Controlled; continue metoprolol  5. Hypothyroidism: Continue Synthroid.  6. Dyslipidemia: Continue gemfibrozil as well as statin therapy  7. Failure to thrive-with a 20 pounds weight loss in the past 6 weeks could be from acute illness F/u with dietitian, provid dietary supplements Family doesn't want any aggressive workup as the patient is chronically demented and 83 year old  DISCHARGE CONDITIONS:    Stable CONSULTS OBTAINED:     PROCEDURES None  DRUG ALLERGIES:   Allergies  Allergen Reactions  . Haldol [Haloperidol Lactate] Hives    DISCHARGE MEDICATIONS:   Current Discharge Medication List    START taking these medications   Details  amoxicillin-clavulanate (AUGMENTIN) 875-125 MG tablet Take 1 tablet by mouth every 12 (twelve) hours. Qty: 14 tablet, Refills: 0    saccharomyces boulardii (FLORASTOR) 250 MG capsule Take 1 capsule (250 mg total) by mouth 2 (two) times daily. Qty: 30 capsule, Refills: 0      CONTINUE these medications which have CHANGED   Details  traMADol (ULTRAM) 50 MG tablet Take 1 tablet (50 mg total) by mouth every 6 (six) hours as needed. Qty: 20 tablet, Refills: 0      CONTINUE these medications which have NOT CHANGED   Details  bisacodyl (DULCOLAX) 5 MG EC tablet Take 5 mg by mouth every 3 (three) days.    docusate sodium (COLACE) 100 MG capsule Take 100 mg by mouth 2 (two) times daily.    levothyroxine (SYNTHROID, LEVOTHROID) 100 MCG tablet Take 100 mcg by mouth daily before breakfast.    metoprolol (LOPRESSOR) 50 MG tablet Take 50 mg by  mouth 2 (two) times daily.    POLYETHYLENE GLYCOL 3350 PO Take 17 g by mouth at bedtime. Mix 1 capful into 8 oz of water    Skin Protectants, Misc. (MINERIN) CREA Apply 1 application topically daily. App after bathing    sodium chloride (OCEAN) 0.65 % SOLN  nasal spray Place 1 spray into both nostrils 2 (two) times daily as needed (prn dry nose).    acetaminophen (TYLENOL) 500 MG tablet Take 1,000 mg by mouth daily as needed for mild pain.          DISCHARGE INSTRUCTIONS:  Follow-up with primary care physician in 2-3 days. Patient needs feeding assistance   DIET:  Low-salt diet needs feeding assistance  DISCHARGE CONDITION:  Stable  ACTIVITY:  Activity as tolerated  OXYGEN:  Home Oxygen: No.   Oxygen Delivery: room air  DISCHARGE LOCATION:  nursing home   If you experience worsening of your admission symptoms, develop shortness of breath, life threatening emergency, suicidal or homicidal thoughts you must seek medical attention immediately by calling 911 or calling your MD immediately  if symptoms less severe.  You Must read complete instructions/literature along with all the possible adverse reactions/side effects for all the Medicines you take and that have been prescribed to you. Take any new Medicines after you have completely understood and accpet all the possible adverse reactions/side effects.   Please note  You were cared for by a hospitalist during your hospital stay. If you have any questions about your discharge medications or the care you received while you were in the hospital after you are discharged, you can call the unit and asked to speak with the hospitalist on call if the hospitalist that took care of you is not available. Once you are discharged, your primary care physician will handle any further medical issues. Please note that NO REFILLS for any discharge medications will be authorized once you are discharged, as it is imperative that you return to your primary care physician (or establish a relationship with a primary care physician if you do not have one) for your aftercare needs so that they can reassess your need for medications and monitor your lab values.     Today  Chief Complaint  Patient presents  with  . Chest Pain   Patient is resting comfortably. No overnight events per medical staff report  ROS: Unobtainable as the patient is demented chronically   VITAL SIGNS:  Blood pressure (!) 159/59, pulse 88, temperature 98.6 F (37 C), temperature source Oral, resp. rate 19, height 5\' 6"  (1.676 m), weight 67.1 kg (147 lb 14.4 oz), SpO2 94 %.  I/O:    Intake/Output Summary (Last 24 hours) at 02/22/16 0939 Last data filed at 02/22/16 0558  Gross per 24 hour  Intake              584 ml  Output                0 ml  Net              584 ml    PHYSICAL EXAMINATION:  GENERAL:  83 y.o.-year-old patient lying in the bed with no acute distress.  EYES: Pupils equal, round, reactive to light and accommodation. No scleral icterus. Extraocular muscles intact.  HEENT: Head atraumatic, normocephalic. Oropharynx and nasopharynx clear.  NECK:  Supple, no jugular venous distention. No thyroid enlargement, no tenderness.  LUNGS: Moderate  breath sounds bilaterally, no wheezing, rales,rhonchi or crepitation. No use of  accessory muscles of respiration.  CARDIOVASCULAR: S1, S2 normal. No murmurs, rubs, or gallops.  ABDOMEN: Soft, non-tender, non-distended. Bowel sounds present. No organomegaly or mass.  EXTREMITIES: No pedal edema, cyanosis, or clubbing.  NEUROLOGIC:  Patient is disoriented with chronic dementiaGait not checked.  PSYCHIATRIC: The patient is alert and  Disoriented SKIN: No obvious rash, lesion, or ulcer.   DATA REVIEW:   CBC  Recent Labs Lab 02/21/16 0740  WBC 7.7  HGB 11.3*  HCT 33.5*  PLT 333    Chemistries   Recent Labs Lab 02/17/16 0049 02/21/16 0740  NA 134*  --   K 3.9  --   CL 99*  --   CO2 28  --   GLUCOSE 146*  --   BUN 12  --   CREATININE 0.41* 0.35*  CALCIUM 8.5*  --     Cardiac Enzymes  Recent Labs Lab 02/17/16 0049  TROPONINI <0.03    Microbiology Results  Results for orders placed or performed during the hospital encounter of 02/17/16   Rapid Influenza A&B Antigens ([facility] only)     Status: None   Collection Time: 02/17/16 12:49 AM  Result Value Ref Range Status   Influenza A ([facility]) NEGATIVE NEGATIVE Final   Influenza B ([facility]) NEGATIVE NEGATIVE Final  Blood Culture (routine x 2)     Status: None   Collection Time: 02/17/16  2:45 AM  Result Value Ref Range Status   Specimen Description BLOOD LEFT FOREARM  Final   Special Requests   Final    BOTTLES DRAWN AEROBIC AND ANAEROBIC  AER 8CC ANA 7CC   Culture NO GROWTH 5 DAYS  Final   Report Status 02/22/2016 FINAL  Final  Blood Culture (routine x 2)     Status: None   Collection Time: 02/17/16  2:46 AM  Result Value Ref Range Status   Specimen Description BLOOD RIGHT FOREARM  Final   Special Requests   Final    BOTTLES DRAWN AEROBIC AND ANAEROBIC  AER 9CC ANA 7CC   Culture NO GROWTH 5 DAYS  Final   Report Status 02/22/2016 FINAL  Final  MRSA PCR Screening     Status: None   Collection Time: 02/17/16  9:53 AM  Result Value Ref Range Status   MRSA by PCR NEGATIVE NEGATIVE Final    Comment:        The GeneXpert MRSA Assay (FDA approved for NASAL specimens only), is one component of a comprehensive MRSA colonization surveillance program. It is not intended to diagnose MRSA infection nor to guide or monitor treatment for MRSA infections.     RADIOLOGY:  Dg Chest 2 View  Result Date: 02/20/2016 CLINICAL DATA:  Shortness of breath. EXAM: CHEST  2 VIEW COMPARISON:  One-view chest x-ray 02/17/2016. FINDINGS: The heart is enlarged. Persistent right lower lobe pneumonia is present. There is new left lower lobe airspace disease. Bilateral effusions are present, left greater than right. The upper lung fields are clear. Mild osteopenia is present. The visualized soft tissues and bony thorax are otherwise unremarkable. IMPRESSION: 1. Persistent right lower lobe pneumonia. 2. Progressive left lower lobe airspace disease and effusion compatible with pneumonia. Electronically Signed    By: Marin Robertshristopher  Mattern M.D.   On: 02/20/2016 13:08    EKG:   Orders placed or performed during the hospital encounter of 02/17/16  . ED EKG  . ED EKG  . EKG 12-Lead  . EKG 12-Lead      Management plans discussed with the patient,  family and they are in agreement.  CODE STATUS:     Code Status Orders        Start     Ordered   02/17/16 0526  Full code  Continuous     02/17/16 0525    Code Status History    Date Active Date Inactive Code Status Order ID Comments User Context   This patient has a current code status but no historical code status.      TOTAL TIME TAKING CARE OF THIS PATIENT: 45 minutes.   Note: This dictation was prepared with Dragon dictation along with smaller phrase technology. Any transcriptional errors that result from this process are unintentional.   @[facility] @  on 02/22/2016 at 9:39 AM  Between 7am to 6pm - Pager - [phone]  After 6pm go to [email or web address] - password [email or web address]  [facility]  Office  [phone]  CC: Primary care physician; Pcp Not In System

## 2016-02-22 NOTE — Discharge Instructions (Signed)
Follow-up with primary care physician at the facility in 2-3 days °

## 2016-02-23 DIAGNOSIS — A419 Sepsis, unspecified organism: Secondary | ICD-10-CM | POA: Diagnosis not present

## 2016-02-23 DIAGNOSIS — R000 Tachycardia, unspecified: Secondary | ICD-10-CM | POA: Diagnosis not present

## 2016-02-23 NOTE — Progress Notes (Signed)
Physical Therapy Treatment Patient Details Name: Tilda FrancoMary L Matheny MRN: [id number] DOB: [birth year] Today's Date: 02/23/2016    History of Present Illness Pt is a 83 y/o F who [facility].  There was concern about potential chest pain as well.  In the ED the pt was found to be febrile and tachypneic, her chest X-ray showed pneumonia, and pt was admitted with sepsis due to pneumonia.  Pt's PMH includes Alzheimer's dementia, anxiety.    PT Comments    Participated in exercises as described below.  Attempted standing at edge of bed.  Bed mobility with max a x 2.  Requires support for sitting balance.  Pt resisting standing with max a x 2 pushing back and yelling out.  Standing attempts stopped.   Follow Up Recommendations  SNF;Supervision/Assistance - 24 hour     Equipment Recommendations  None recommended by PT    Recommendations for Other Services       Precautions / Restrictions Precautions Precautions: Fall Restrictions Weight Bearing Restrictions: No    Mobility  Bed Mobility Overal bed mobility: Needs Assistance Bed Mobility: Supine to Sit;Sit to Supine     Supine to sit: Mod assist;HOB elevated;+2 for physical assistance Sit to supine: Max assist;+2 for physical assistance   General bed mobility comments: attempts to assist when hands are placed on rail but unable to elevate trunk off bed.  Transfers                 General transfer comment: attemtped to stand but resists and yells out.    Ambulation/Gait                 Stairs            Wheelchair Mobility    Modified Rankin (Stroke Patients Only)       Balance Overall balance assessment: Needs assistance Sitting-balance support: Feet supported;Single extremity supported Sitting balance-Leahy Scale: Poor Sitting balance - Comments: Pt requires at least 1 UE spported while sitting EOB and demonstrates a posterior lean requiring cues for  upright.   Postural control: Posterior lean                          Cognition Arousal/Alertness: Awake/alert Behavior During Therapy: Anxious Overall Cognitive Status: History of cognitive impairments - at baseline                      Exercises Other Exercises Other Exercises: Supine AA/PROM BLE in supine for ankle pumps, heel slides, Ab/AD    General Comments        Pertinent Vitals/Pain Pain Assessment: No/denies pain    Home Living                      Prior Function            PT Goals (current goals can now be found in the care plan section)      Frequency    Min 2X/week      PT Plan Current plan remains appropriate    Co-evaluation             End of Session Equipment Utilized During Treatment: Gait belt Activity Tolerance: Patient limited by fatigue Patient left: in bed;with call bell/phone within reach;with bed alarm set     Time: [id number] PT Time Calculation (min) (ACUTE ONLY): 11 min  Charges:  $Therapeutic Activity: 8-22  mins                    G Codes:      Danielle Dess 2016-02-25, 10:23 AM

## 2016-02-23 NOTE — Progress Notes (Signed)
[facility] Physicians - [place] at [facility]   PATIENT NAME: Angie Larsen    MR#:  [id number]  DATE OF BIRTH:  08-02-1933  SUBJECTIVE:  CHIEF COMPLAINT:  Patient is resting comfortably n demented .No over night events    REVIEW OF SYSTEMS:  ROS Unobtainable  DRUG ALLERGIES:   Allergies  Allergen Reactions  . Haldol [Haloperidol Lactate] Hives    VITALS:  Blood pressure (!) 144/55, pulse 90, temperature 99.3 F (37.4 C), temperature source Axillary, resp. rate 20, height 5\' 6"  (1.676 m), weight 65.9 kg (145 lb 3.2 oz), SpO2 97 %.  PHYSICAL EXAMINATION:  GENERAL:  83 y.o.-year-old patient lying in the bed with no acute distress.  EYES: Pupils equal, round, reactive to light and accommodation. No scleral icterus. Extraocular muscles intact.  HEENT: Head atraumatic, normocephalic. Oropharynx and nasopharynx clear.  NECK:  Supple, no jugular venous distention. No thyroid enlargement, no tenderness.  LUNGS:  Diminished sounds bilaterally,Positive crackles no wheezing, rales,rhonchi or crepitation. No use of accessory muscles of respiration.  CARDIOVASCULAR: S1, S2 normal. No murmurs, rubs, or gallops.  ABDOMEN: Soft, nontender, nondistended. Bowel sounds present. No organomegaly or mass.  EXTREMITIES: No pedal edema, cyanosis, or clubbing.  NEUROLOGIC: Patient is alert but chronically demented spontaneously moving extremities PSYCHIATRIC: The patient is alert but chronically demented  SKIN: No obvious rash, lesion, or ulcer.    LABORATORY PANEL:   CBC  Recent Labs Lab 02/21/16 0740  WBC 7.7  HGB 11.3*  HCT 33.5*  PLT 333   ------------------------------------------------------------------------------------------------------------------  Chemistries   Recent Labs Lab 02/17/16 0049 02/21/16 0740  NA 134*  --   K 3.9  --   CL 99*  --   CO2 28  --   GLUCOSE 146*  --   BUN 12  --   CREATININE 0.41* 0.35*  CALCIUM 8.5*  --     ------------------------------------------------------------------------------------------------------------------  Cardiac Enzymes  Recent Labs Lab 02/17/16 0049  TROPONINI <0.03   ------------------------------------------------------------------------------------------------------------------  RADIOLOGY:  No results found.  EKG:   Orders placed or performed during the hospital encounter of 02/17/16  . ED EKG  . ED EKG  . EKG 12-Lead  . EKG 12-Lead    ASSESSMENT AND PLAN:   83 year old female presenting with fevers   1. Sepsis: Secondary to pneumonia  The patient's meets septic criteria via fever, tachycardia and tachypnea. She is hemodynamically stable Initially patient is started on Zosyn and vancomycin. Vancomycin discontinued as MRSA PCR is negative Clinically improving. Blood cultures are negative 2 days ChangedIV Zosyn to IV Rocephin. Continue IV Rocephin andazithromycin. We will dc patient with by mouth Augmentin with probiotics for 1 more week- started 02/22/16 .  2. Healthcare associated pneumonia: Zosyn changed to IV Rocephin and continue azithromycin Discharge with by mouth Augmentin. Patient completed azithromycin for 5 days   3. Dementia: Continue Paxil and Depakote for mood stabilization  4. Hypertension: Controlled; continue metoprolol  5. Hypothyroidism: Continue Synthroid.  6. Dyslipidemia: Continue gemfibrozil as well as statin therapy  7. Failure to thrive-with a 20 pounds weight loss in the past 6 weeks could be from acute illness F/u with dietitian Family doesn't want any aggressive workup as the patient is chronically demented and 83 year old  . DVT prophylaxis: Lovenox  . GI prophylaxis: None   physical therapy-recommending skilled facility.  Follow up with case management awaiting bed, placement  All the records are reviewed and case discussed with Care Management/Social Workerr. Management plans discussed with the patient,  family and  they are in agreement.  CODE STATUS: FC   TOTAL TIME TAKING CARE OF THIS PATIENT: 30  minutes.   POSSIBLE D/C IN 1-2  DAYS, DEPENDING ON CLINICAL CONDITION.  Note: This dictation was prepared with Dragon dictation along with smaller phrase technology. Any transcriptional errors that result from this process are unintentional.   Ramonita LabGouru, Abree Romick M.D on 02/23/2016 at 8:41 PM  Between 7am to 6pm - Pager - [phone] After 6pm go to [email or web address] - password [email or web address]  [facility] [place] [facility]  Office  [phone]  CC: Primary care physician; Pcp Not In System

## 2016-02-23 NOTE — Progress Notes (Addendum)
[facility] [employer] authorization is still pending.  Jomarie LongsJoseph called Visual merchandiserClinical Social Worker (CSW) back at 11:12 am today and reported that [facility] authorization has been received. PASARR has been received [phone] to [facility] today due to inclement weather. MD aware of above. CSW contacted patient's sister/ guardian [name] and made her aware of above. Plan is for patient to D/C tomorrow to [facility].      [employer]Bailey Marticia Reifschneider, LCSW [phone]

## 2016-02-24 DIAGNOSIS — R000 Tachycardia, unspecified: Secondary | ICD-10-CM | POA: Diagnosis not present

## 2016-02-24 DIAGNOSIS — A419 Sepsis, unspecified organism: Secondary | ICD-10-CM | POA: Diagnosis not present

## 2016-02-24 LAB — CREATININE, SERUM
Creatinine, Ser: 0.47 mg/dL (ref 0.44–1.00)
GFR calc Af Amer: >60 mL/min (ref >60)
GFR calc non Af Amer: >60 mL/min (ref >60)

## 2016-02-24 MED ORDER — AMOXICILLIN-POT CLAVULANATE 875-125 MG PO TABS: 1.0000 | ORAL_TABLET | Freq: Two times a day (BID) | ORAL | 0 refills | Status: AC

## 2016-02-24 NOTE — Care Management Important Message (Signed)
Important Message  Patient Details  Name: Angie Larsen MRN: [id number] Date of Birth: [birth year]   Medicare Important Message Given:  Yes    Gwenette GreetBrenda S Acxel Dingee, RN 02/24/2016, 9:56 AM

## 2016-02-24 NOTE — Clinical Social Work Placement (Signed)
   CLINICAL SOCIAL WORK PLACEMENT  NOTE  Date:  02/24/2016  Patient Details  Name: Angie Larsen MRN: [id number] Date of Birth: 06-02-1933  Clinical Social Work is seeking post-discharge placement for this patient at the Skilled  Nursing Facility level of care (*CSW will initial, date and re-position this form in  chart as items are completed):  Yes   Patient/family provided with [facility] Clinical Social Work Department's list of facilities offering this level of care within the geographic area requested by the patient (or if unable, by the patient's family).  Yes   Patient/family informed of their freedom to choose among providers that offer the needed level of care, that participate in Medicare, Medicaid or managed care program needed by the patient, have an available bed and are willing to accept the patient.  Yes   Patient/family informed of [facility]'s ownership interest in [facility] and [facility], as well as of the fact that they are under no obligation to receive care at these facilities.  PASRR submitted to EDS on 02/21/16     PASRR number received on 02/23/16     Existing PASRR number confirmed on       FL2 transmitted to all facilities in geographic area requested by pt/family on 02/22/16     FL2 transmitted to all facilities within larger geographic area on       Patient informed that his/her managed care company has contracts with or will negotiate with certain facilities, including the following:        Yes   Patient/family informed of bed offers received.  Patient chooses bed at  ([facility] )     Physician recommends and patient chooses bed at      Patient to be transferred to  ([facility] ) on 02/24/16.  Patient to be transferred to facility by  [facility] )     Patient family notified on 02/24/16 of transfer.  Name of family member notified:   (Patient's sister/guardian [name] is aware of D/C today. )     PHYSICIAN       Additional  Comment:    _______________________________________________ Phuc Kluttz, Darleen CrockerBailey M, LCSW 02/24/2016, 12:42 PM

## 2016-02-24 NOTE — Progress Notes (Signed)
Report called to [place] as [facility]. EMS notified for transport

## 2016-02-24 NOTE — Progress Notes (Signed)
EMS is here to transport the patient 

## 2016-02-24 NOTE — Progress Notes (Signed)
Patient is medically stable for D/C back to [facility] today. Per Jomarie LongsJoseph [facility] liaison patient can come to room 607 and will have to bring a few tablets of ultram because [facility] pharmacy can't get it today due the inclement weather. [facility] to send [facility] to [facility] with 4 ultram tablets today. [employer] authorization has been received. RN will call report and arrange EMS for transport. Clinical Child psychotherapistocial Worker (CSW) sent D/C orders to [facility] today. Patient is aware of above. CSW contacted patient's sister/ guardian [name] and made her aware of above. Please reconsult if future social work needs arise. CSW signing off.   [employer]iley Ajani Schnieders, LCSW [phone]

## 2016-02-24 NOTE — Discharge Summary (Signed)
[facility] - [place] at [facility]   PATIENT NAME: Angie Larsen    MR#:  [id number]  DATE OF BIRTH:  Aug 28, 1933  DATE OF ADMISSION:  02/17/2016 ADMITTING PHYSICIAN: Arnaldo Natal, MD  DATE OF DISCHARGE:02/24/16 PRIMARY CARE PHYSICIAN: Pcp Not In System    ADMISSION DIAGNOSIS:  HCAP (healthcare-associated pneumonia) [J18.9] Sepsis, due to unspecified organism [facility]) [A41.9]  DISCHARGE DIAGNOSIS:  Active Problems:   Sepsis (HCC)  Healthcare associated pneumonia SECONDARY DIAGNOSIS:   Past Medical History:  Diagnosis Date  . Alzheimer's dementia   . Anxiety   . Depression   . GERD (gastroesophageal reflux disease)   . Hypertension     HOSPITAL COURSE:  Chief Complaint: Tachycardia HPI: The patient with past medical history of dementia and hypertension presents to the emergency department from her nursing home due to tachycardia. Apparently the patient made some gestured toward her chest at the time the facility staff was evaluating her which gave them concern for chest pain. The patient does not verbalize this complaint. In the emergency department however she was found to be febrile and tachypneic. Chest x-ray showed multifocal pneumonia. Dr. protocol was initiated and the emergency department staff called the hospital service for admission.Please review history and physical for details  Hospital course  1. Sepsis: Secondary to pneumonia  The patient's meets septic criteria via fever, tachycardia and tachypnea.  She is hemodynamically stable Initially patient is started on Zosyn and vancomycin. Vancomycin discontinued as MRSA PCR is negative Clinically improving. Blood cultures are negative 2 days Changed IV Zosyn to IV Rocephin. Continue IV Rocephin and azithromycin. We will discharge patient with by mouth Augmentin with probiotics for 10 more doses .  2. Healthcare associated pneumonia: Zosyn changed to IV Rocephin and continue  azithromycin Discharge with by mouth Augmentin. Patient completed azithromycin for 5 days  3. Dementia: Continue Paxil and Depakote for mood stabilization  4. Hypertension: Controlled; continue metoprolol  5. Hypothyroidism: Continue Synthroid.  6. Dyslipidemia: Continue gemfibrozil as well as statin therapy  7. Failure to thrive-with a 20 pounds weight loss in the past 6 weeks could be from acute illness F/u with dietitian, provid dietary supplements Family doesn't want any aggressive workup as the patient is chronically demented and 83 year old with no quality life  DISCHARGE CONDITIONS:    Stable CONSULTS OBTAINED:     PROCEDURES None  DRUG ALLERGIES:   Allergies  Allergen Reactions  . Haldol [Haloperidol Lactate] Hives    DISCHARGE MEDICATIONS:   Current Discharge Medication List    START taking these medications   Details  amoxicillin-clavulanate (AUGMENTIN) 875-125 MG tablet Take 1 tablet by mouth every 12 (twelve) hours. Qty: 10 tablet, Refills: 0    saccharomyces boulardii (FLORASTOR) 250 MG capsule Take 1 capsule (250 mg total) by mouth 2 (two) times daily. Qty: 30 capsule, Refills: 0      CONTINUE these medications which have CHANGED   Details  traMADol (ULTRAM) 50 MG tablet Take 1 tablet (50 mg total) by mouth every 6 (six) hours as needed. Qty: 20 tablet, Refills: 0      CONTINUE these medications which have NOT CHANGED   Details  bisacodyl (DULCOLAX) 5 MG EC tablet Take 5 mg by mouth every 3 (three) days.    docusate sodium (COLACE) 100 MG capsule Take 100 mg by mouth 2 (two) times daily.    levothyroxine (SYNTHROID, LEVOTHROID) 100 MCG tablet Take 100 mcg by mouth daily before breakfast.    metoprolol (LOPRESSOR) 50  MG tablet Take 50 mg by mouth 2 (two) times daily.    POLYETHYLENE GLYCOL 3350 PO Take 17 g by mouth at bedtime. Mix 1 capful into 8 oz of water    Skin Protectants, Misc. (MINERIN) CREA Apply 1 application topically daily.  App after bathing    sodium chloride (OCEAN) 0.65 % SOLN nasal spray Place 1 spray into both nostrils 2 (two) times daily as needed (prn dry nose).    acetaminophen (TYLENOL) 500 MG tablet Take 1,000 mg by mouth daily as needed for mild pain.          DISCHARGE INSTRUCTIONS:  Follow-up with primary care physician in 2-3 days. Patient needs feeding assistance   DIET:  Low-salt diet needs feeding assistance  DISCHARGE CONDITION:  Stable  ACTIVITY:  Activity as tolerated  OXYGEN:  Home Oxygen: No.   Oxygen Delivery: room air  DISCHARGE LOCATION:  nursing home   If you experience worsening of your admission symptoms, develop shortness of breath, life threatening emergency, suicidal or homicidal thoughts you must seek medical attention immediately by calling 911 or calling your MD immediately  if symptoms less severe.  You Must read complete instructions/literature along with all the possible adverse reactions/side effects for all the Medicines you take and that have been prescribed to you. Take any new Medicines after you have completely understood and accpet all the possible adverse reactions/side effects.   Please note  You were cared for by a hospitalist during your hospital stay. If you have any questions about your discharge medications or the care you received while you were in the hospital after you are discharged, you can call the unit and asked to speak with the hospitalist on call if the hospitalist that took care of you is not available. Once you are discharged, your primary care physician will handle any further medical issues. Please note that NO REFILLS for any discharge medications will be authorized once you are discharged, as it is imperative that you return to your primary care physician (or establish a relationship with a primary care physician if you do not have one) for your aftercare needs so that they can reassess your need for medications and monitor your lab  values.     Today  Chief Complaint  Patient presents with  . Chest Pain   Patient is resting comfortably. No overnight events per medical staff report  ROS: Unobtainable as the patient is demented chronically   VITAL SIGNS:  Blood pressure (!) 159/47, pulse 90, temperature 98.3 F (36.8 C), resp. rate 18, height 5\' 6"  (1.676 m), weight 65.9 kg (145 lb 3.2 oz), SpO2 97 %.  I/O:   No intake or output data in the 24 hours ending 02/24/16 1044  PHYSICAL EXAMINATION:  GENERAL:  83 y.o.-year-old patient lying in the bed with no acute distress.  EYES: Pupils equal, round, reactive to light and accommodation. No scleral icterus. Extraocular muscles intact.  HEENT: Head atraumatic, normocephalic. Oropharynx and nasopharynx clear.  NECK:  Supple, no jugular venous distention. No thyroid enlargement, no tenderness.  LUNGS: Moderate  breath sounds bilaterally, no wheezing, rales,rhonchi or crepitation. No use of accessory muscles of respiration.  CARDIOVASCULAR: S1, S2 normal. No murmurs, rubs, or gallops.  ABDOMEN: Soft, non-tender, non-distended. Bowel sounds present. No organomegaly or mass.  EXTREMITIES: No pedal edema, cyanosis, or clubbing.  NEUROLOGIC:  Patient is disoriented with chronic dementiaGait not checked.  PSYCHIATRIC: The patient is alert and  Disoriented SKIN: No obvious rash, lesion, or  ulcer.   DATA REVIEW:   CBC  Recent Labs Lab 02/21/16 0740  WBC 7.7  HGB 11.3*  HCT 33.5*  PLT 333    Chemistries   Recent Labs Lab 02/24/16 0509  CREATININE 0.47    Cardiac Enzymes No results for input(s): TROPONINI in the last 168 hours.  Microbiology Results  Results for orders placed or performed during the hospital encounter of 02/17/16  Rapid Influenza A&B Antigens ([facility] only)     Status: None   Collection Time: 02/17/16 12:49 AM  Result Value Ref Range Status   Influenza A ([facility]) NEGATIVE NEGATIVE Final   Influenza B ([facility]) NEGATIVE NEGATIVE Final  Blood  Culture (routine x 2)     Status: None   Collection Time: 02/17/16  2:45 AM  Result Value Ref Range Status   Specimen Description BLOOD LEFT FOREARM  Final   Special Requests   Final    BOTTLES DRAWN AEROBIC AND ANAEROBIC  AER 8CC ANA 7CC   Culture NO GROWTH 5 DAYS  Final   Report Status 02/22/2016 FINAL  Final  Blood Culture (routine x 2)     Status: None   Collection Time: 02/17/16  2:46 AM  Result Value Ref Range Status   Specimen Description BLOOD RIGHT FOREARM  Final   Special Requests   Final    BOTTLES DRAWN AEROBIC AND ANAEROBIC  AER 9CC ANA 7CC   Culture NO GROWTH 5 DAYS  Final   Report Status 02/22/2016 FINAL  Final  MRSA PCR Screening     Status: None   Collection Time: 02/17/16  9:53 AM  Result Value Ref Range Status   MRSA by PCR NEGATIVE NEGATIVE Final    Comment:        The GeneXpert MRSA Assay (FDA approved for NASAL specimens only), is one component of a comprehensive MRSA colonization surveillance program. It is not intended to diagnose MRSA infection nor to guide or monitor treatment for MRSA infections.     RADIOLOGY:  Dg Chest 2 View  Result Date: 02/20/2016 CLINICAL DATA:  Shortness of breath. EXAM: CHEST  2 VIEW COMPARISON:  One-view chest x-ray 02/17/2016. FINDINGS: The heart is enlarged. Persistent right lower lobe pneumonia is present. There is new left lower lobe airspace disease. Bilateral effusions are present, left greater than right. The upper lung fields are clear. Mild osteopenia is present. The visualized soft tissues and bony thorax are otherwise unremarkable. IMPRESSION: 1. Persistent right lower lobe pneumonia. 2. Progressive left lower lobe airspace disease and effusion compatible with pneumonia. Electronically Signed   By: Marin Roberts M.D.   On: 02/20/2016 13:08    EKG:   Orders placed or performed during the hospital encounter of 02/17/16  . ED EKG  . ED EKG  . EKG 12-Lead  . EKG 12-Lead      Management plans discussed  with the patient, family and they are in agreement.  CODE STATUS:     Code Status Orders        Start     Ordered   02/17/16 0526  Full code  Continuous     02/17/16 0525    Code Status History    Date Active Date Inactive Code Status Order ID Comments User Context   This patient has a current code status but no historical code status.      TOTAL TIME TAKING CARE OF THIS PATIENT: 45 minutes.   Note: This dictation was prepared with Dragon dictation along with smaller phrase  technology. Any transcriptional errors that result from this process are unintentional.   @MEC @  on 02/24/2016 at 10:44 AM  Between 7am to 6pm - Pager - [phone]  After 6pm go to [email or web address] - password [email or web address]  Fabio Neighborsagle [place] [facility]  Office  [phone]  CC: Primary care physician; Pcp Not In System

## 2016-03-17 NOTE — Progress Notes (Signed)
   02/21/16 1149  PT Time Calculation  PT Start Time (ACUTE ONLY) 1114  PT Stop Time (ACUTE ONLY) 1139  PT Time Calculation (min) (ACUTE ONLY) 25 min  PT G-Codes **NOT FOR INPATIENT CLASS**  Functional Assessment Tool Used clinical judgment  Functional Limitation Mobility: Walking and moving around  Mobility: Walking and Moving Around Current Status (H0865(G8978) CM  Mobility: Walking and Moving Around Goal Status (H8469(G8979) CL  PT General Charges  $$ ACUTE PT VISIT 1 Procedure  PT Evaluation  $PT Eval Low Complexity 1 Procedure  PT Treatments  $Therapeutic Activity 8-22 mins    Late-entry g-codes entered after review of initial evaluation/documentation completed by Encarnacion ChuAshley Abashian, PT.  Tommy RainwaterKristen H. Manson PasseyBrown, PT, DPT, NCS 03/17/16, 11:17 AM [phone]

## 2018-03-15 ENCOUNTER — Encounter: Payer: Self-pay | Admitting: Nurse Practitioner

## 2018-03-15 ENCOUNTER — Non-Acute Institutional Stay: Payer: Medicare HMO | Admitting: Nurse Practitioner

## 2018-03-15 VITALS — HR 78 | Temp 98.0°F | Resp 18 | Wt 118.0 lb

## 2018-03-15 DIAGNOSIS — R1310 Dysphagia, unspecified: Secondary | ICD-10-CM | POA: Insufficient documentation

## 2018-03-15 DIAGNOSIS — R413 Other amnesia: Secondary | ICD-10-CM

## 2018-03-15 DIAGNOSIS — Z515 Encounter for palliative care: Secondary | ICD-10-CM | POA: Insufficient documentation

## 2018-03-15 NOTE — Progress Notes (Signed)
Therapist, nutritional Palliative Care Consult Note Telephone: [phone]  Fax: [phone]  PATIENT NAME: Angie Larsen DOB: 1933/11/30 MRN: [id number]  PRIMARY CARE PROVIDER:   Dr Cory Munch PROVIDER: Dr Slade-Hermann/[facility] RESPONSIBLE PARTY:   Angie Larsen sister [phone]  RECOMMENDATIONS and PLAN:   1. Palliative care encounter Z51.5; Palliative medicine team will continue to support patient, patient's family, and medical team. Visit consisted of counseling and education dealing with the complex and emotionally intense issues of symptom management and palliative care in the setting of serious and potentially life-threatening illness  2. Dysphagia R13.10; secondary to dementia . Continue to monitor weights, appetite, aspiration precautions.   3. Memory loss R41.3 secondary to dementia. Continue with supportive measures as chronic disease remains Progressive.  ASSESSMENT:     I visited and observe Angie Larsen. She did make eye contact and smile with verbal cues. She was co with assessment. Then meaningful discussion due to cognitive impairment. She does appear thin, debilitated but content. She is a DNR with medical goals to focus on comfort with most in place. I have attempted to contact Angie Larsen, her niece Healthcare power of attorney for update on palliative care visit. No new changes to current goals or plan of care. I called and left a message for Angie Larsen, Angie Larsen sister to return call for update on palliative care visit. Updated nursing staff in any changes to current goals or plan of care  Angie Larsen, Angie Larsen sister return my call. Talked about purpose for palliative care visit. Verbal consent to continue palliative care following. We talked about palliative care visit with Angie Larsen. We talked about clinical update. We talked about symptoms which she currently remains asymptomatic. We talked about weight which is  stable. We talked about overall functional and cognitive decline. We talked about her sitting in the day room and staff attempting to keep her out of her room, socializing. We talked about progression of chronic disease and sending of dementia and at present time the stack it does continue to remain stable. Talk to that role of palliative care and plan of care. Medical goals remain focus on comfort with DNR and place. Will follow up in 3 months if needed or sooner should she declined. Angie Larsen was an agreement with plan of care.  Palliative care 1/8 / 2018 Palliative care 5 / 5 / 2014 to 5 / 7 / 2014 Palliative care 1 / 06/2016 to 2 / 1 / 2018 Hospice 4/20 / 2018 to 12 / 6 / 2018  I spent 60 minutes providing this consultation,  from 8:00am to 9:00am. More than 50% of the time in this consultation was spent coordinating communication.   HISTORY OF PRESENT ILLNESS:  Angie Larsen is a 85 y.o. year old female with multiple medical problems including Alzheimer's dementia, dysphasia, hypertension, gerd, anxiety, depression. Angie. Langin continues to reside in skilled long-term care facility. She has a lift to the recliner where she sits in a Stage manager. She is total ADL dependence with incontinence. She is cognitively impaired and not able to verbalize her needs. No recent wounds, infections, hospitalizations , Falls.  Last Regulatory visit 12 / 10 / 2019 for Alzheimer's dementia, hypertension, gerd, depression, anxiety. She does shake her head yes or no with some verbal answers. She is confused. She can stand and pivot and it's Mobile in a wheelchair. She's incontinent of bowel and bladder with total ADL assistance. She  is fed by staff requiring a pureed diet dudas is fascia. She does have generalized weakness especially in lower extremity. Teeth is in for repair. She does remain a DNR. At present she is sitting in the high back recliner in the day room. She appears comfortable, thin, confused. No  visitors present. Palliative Care was asked to help address goals of care.   CODE STATUS: DNR  PPS: 30% HOSPICE ELIGIBILITY/DIAGNOSIS: TBD  PAST MEDICAL HISTORY:  Past Medical History:  Diagnosis Date  . Alzheimer's dementia (HCC)   . Anxiety   . Depression   . GERD (gastroesophageal reflux disease)   . Hypertension     SOCIAL HX:  Social History   Tobacco Use  . Smoking status: Never Smoker  . Smokeless tobacco: Never Used  Substance Use Topics  . Alcohol use: No    ALLERGIES:  Allergies  Allergen Reactions  . Haldol [Haloperidol Lactate] Hives     PERTINENT MEDICATIONS:  Outpatient Encounter Medications as of 03/15/2018  Medication Sig  . acetaminophen (TYLENOL) 500 MG tablet Take 1,000 mg by mouth daily as needed for mild pain.   . bisacodyl (DULCOLAX) 5 MG EC tablet Take 5 mg by mouth every 3 (three) days.  Marland Kitchen docusate sodium (COLACE) 100 MG capsule Take 100 mg by mouth 2 (two) times daily.  Marland Kitchen levothyroxine (SYNTHROID, LEVOTHROID) 100 MCG tablet Take 100 mcg by mouth daily before breakfast.  . metoprolol (LOPRESSOR) 50 MG tablet Take 50 mg by mouth 2 (two) times daily.  Marland Kitchen POLYETHYLENE GLYCOL 3350 PO Take 17 g by mouth at bedtime. Mix 1 capful into 8 oz of water  . saccharomyces boulardii (FLORASTOR) 250 MG capsule Take 1 capsule (250 mg total) by mouth 2 (two) times daily.  . Skin Protectants, Misc. (MINERIN) CREA Apply 1 application topically daily. App after bathing  . sodium chloride (OCEAN) 0.65 % SOLN nasal spray Place 1 spray into both nostrils 2 (two) times daily as needed (prn dry nose).  . traMADol (ULTRAM) 50 MG tablet Take 1 tablet (50 mg total) by mouth every 6 (six) hours as needed.   No facility-administered encounter medications on file as of 03/15/2018.     PHYSICAL EXAM:   General: NAD, frail appearing, thin, chronically ill, confused female Cardiovascular: regular rate and rhythm Pulmonary: clear ant fields Abdomen: soft, nontender, + bowel  sounds GU: no suprapubic tenderness Extremities: no edema, no joint deformities; muscle wasting Skin: no rashes Neurological: Weakness but otherwise nonfocal/functional quadriplegic  Angie Tuzzolino Prince [place], NP

## 2018-04-30 ENCOUNTER — Non-Acute Institutional Stay: Payer: Medicare HMO | Admitting: Primary Care

## 2018-04-30 ENCOUNTER — Other Ambulatory Visit: Payer: Self-pay

## 2018-04-30 DIAGNOSIS — Z515 Encounter for palliative care: Secondary | ICD-10-CM

## 2018-04-30 DIAGNOSIS — R413 Other amnesia: Secondary | ICD-10-CM

## 2018-04-30 NOTE — Progress Notes (Signed)
Therapist, nutritional Palliative Care Consult Note Telephone: [phone]  Fax: [phone]  PATIENT NAME: Angie Larsen DOB: 02-21-1933 MRN: [id number]  PRIMARY CARE PROVIDER:   Keane Police, MD  REFERRING PROVIDER:  Keane Police, MD [phone] [address] Angie [place] [address]  RESPONSIBLE PARTY:   Extended Emergency Contact Information Primary Emergency Contact: [facility] Address: [address]          [place], [place] [address] Darden Amber of [place] Home Phone: [phone] Relation: Legal Guardian Secondary Emergency Contact: Angie Larsen Address: [address]          [place], [place] [address] Darden Amber of [place] Home Phone: [phone] Relation: Brother  Palliative Care is following patient at the consultation  request of Dr. Lovenia Larsen.  ASSESSMENT and RECOMMENDATIONS :   1. Goals of Care: Patient is DNR in record. Patient is not able to make needs known. DNR on chart. T/c to [facility] [place] but no answer, number left for follow up.  2. Diet/nutrition: Continue to monitor weights as indicator for decline in intake.Weights are rising (5 pounds over last 2 exams). However her overall weight loss is appro.x 40 lbs in the past 2 years. Requires full assistance with meals due to advanced dementia.   3 Dementia: Continue to monitor for disease progression. FAST score 7 c-d.  Patient presented today in bed, states she does not feel good but cannot elucidate. She attempts some discussion but not intelligible.   Palliative Care to follow for ongoing clarification of goals of care, symptom management. Return 4-6 weeks or prn.  I spent 15 minutes providing this consultation,  from 1515 to 1530. More than 50% of the time in this consultation was spent coordinating communication.   HISTORY OF PRESENT ILLNESS:  Angie Larsen is a 85 y.o. year old female with multiple medical problems including Alzheimer's Dementia, Anxiety, depression,  HTN . Palliative Care was asked to help address goals of care.   CODE STATUS: DNR  PPS: 30% HOSPICE ELIGIBILITY/DIAGNOSIS: TBD  PAST MEDICAL HISTORY:  Past Medical History:  Diagnosis Date  . Alzheimer's dementia (HCC)   . Anxiety   . Depression   . GERD (gastroesophageal reflux disease)   . Hypertension     SOCIAL HX:  Social History   Tobacco Use  . Smoking status: Never Smoker  . Smokeless tobacco: Never Used  Substance Use Topics  . Alcohol use: No    ALLERGIES:  Allergies  Allergen Reactions  . Haldol [Haloperidol Lactate] Hives     PERTINENT MEDICATIONS:  Outpatient Encounter Medications as of 04/30/2018  Medication Sig  . acetaminophen (TYLENOL) 500 MG tablet Take 1,000 mg by mouth daily as needed for mild pain.   . bisacodyl (DULCOLAX) 5 MG EC tablet Take 5 mg by mouth every 3 (three) days.  Marland Kitchen docusate sodium (COLACE) 100 MG capsule Take 100 mg by mouth 2 (two) times daily.  Marland Kitchen levothyroxine (SYNTHROID, LEVOTHROID) 100 MCG tablet Take 100 mcg by mouth daily before breakfast.  . metoprolol (LOPRESSOR) 50 MG tablet Take 50 mg by mouth 2 (two) times daily.  Marland Kitchen POLYETHYLENE GLYCOL 3350 PO Take 17 g by mouth at bedtime. Mix 1 capful into 8 oz of water  . saccharomyces boulardii (FLORASTOR) 250 MG capsule Take 1 capsule (250 mg total) by mouth 2 (two) times daily.  . Skin Protectants, Misc. (MINERIN) CREA Apply 1 application topically daily. App after bathing  . sodium chloride (OCEAN) 0.65 % SOLN nasal spray Place 1 spray  into both nostrils 2 (two) times daily as needed (prn dry nose).  . traMADol (ULTRAM) 50 MG tablet Take 1 tablet (50 mg total) by mouth every 6 (six) hours as needed.   No facility-administered encounter medications on file as of 04/30/2018.     PHYSICAL EXAM:    VS Deferred by patient request Weight in Feb. was 123 lb, in Jan. Was  118 lb. 2 years ago it was 160 lb. General: NAD, frail appearing, thin Cardiovascular: regular rate and rhythm  Pulmonary: no cough, no SOB Abdomen: soft, nontender, , incontinent of bowel GU: no suprapubic tenderness, incontinent of bladder Extremities: no edema, no joint deformities Skin: no rashes or wounds on gross exam Neurological: Weakness , non ambulatory, unable to communicate verbally more than a few words.  Marijo File DNP, AGPCNP-BC

## 2018-05-17 ENCOUNTER — Non-Acute Institutional Stay: Payer: Medicare HMO | Admitting: Nurse Practitioner

## 2018-05-17 ENCOUNTER — Encounter: Payer: Self-pay | Admitting: Nurse Practitioner

## 2018-05-17 VITALS — HR 78 | Resp 18 | Wt 119.0 lb

## 2018-05-17 DIAGNOSIS — Z515 Encounter for palliative care: Secondary | ICD-10-CM

## 2018-05-17 DIAGNOSIS — R413 Other amnesia: Secondary | ICD-10-CM

## 2018-05-17 DIAGNOSIS — R1310 Dysphagia, unspecified: Secondary | ICD-10-CM

## 2018-05-17 NOTE — Progress Notes (Signed)
[facility] Consult Note Telephone: [phone]  Fax: [phone]  PATIENT NAME: Angie Larsen DOB: February 10, 1933 MRN: [id number]  PRIMARY CARE PROVIDER:   Keane PoliceSlade-Hartman, Venezela, MD  REFERRING PROVIDER:  Dr Slade-Hartman/[facility] RESPONSIBLE PARTY:     Javier Glazierlsie Hunter sister [phone]  RECOMMENDATIONS and PLAN:   1. Palliative care encounter Z51.5; Palliative medicine team will continue to support patient, patient's family, and medical team. Visit consisted of counseling and education dealing with the complex and emotionally intense issues of symptom management and palliative care in the setting of serious and potentially life-threatening illness  2. Dysphagia R13.10; secondary to dementia . Continue to monitor weights, appetite, aspiration precautions.   3. Memory loss R41.3 secondary to dementia. Continue with supportive measures as chronic disease remains Progressive.  ASSESSMENT:     I visited and observe Ms Angie Larsen. I attempted to explain palliative care visit but with limited cognition she was unable to process. She mumbled some words and continue to make eye contact. She was Cooperative with assessment. She does appear to be over all slowly declining in the setting of dementia with decrease in cognition, functional abilities. Weight has remained relatively stable. Will continue to follow monitor with palliative care should she continued to further decline, lose weight 10% will discuss option of Hospice Services with family. She is a DNR. I updated nursing staff new new changes with current goals or plan of care. I attempted to contact Javier GlazierElsie Hunter, message left to update on PC visit  Sodium 138, potassium 4.3, chloride 100, Co2 28, calcium 8.7, bun 14.7, creatinine 0.36, glucose 73, total protein 6.2, albumin 3.4  Palliative care 1/8 / 2018 Palliative care 5 / 5 / 2014 to 5 / 7 / 2014 Palliative care 1 / 06/2016 to 2 / 1 /  2018 Hospice 4/20 / 2018 to 12 / 6 / 2018   05/01/2018 weight 119 lbs  I spent 45 minutes providing this consultation,  from 12:45pm to 1:30pm . More than 50% of the time in this consultation was spent coordinating communication.   HISTORY OF PRESENT ILLNESS:  Angie Larsen is a 85 y.o. year old female with multiple medical problems including Alzheimer's dementia, dysphasia, hypertension, gerd, anxiety, depression.Ms Angie Larsen continues to reside at [facility] long[facility]. She does remain total care with transfers, adl's and incontinence. Appetite has been Fair per staff. No recent infections, wounds, hospitalizations, Falls. Last primary provider visit 2 / 13 / 2020 for Alzheimer's dementia, due to advanced dementia unable to provide review of systems or mmse. No new changes to current goals are care. She is cognitively impaired and then they able to verbalize her needs. At present she is lying in bed, appears confused but comfortable. No visitors present. Palliative Care was asked to help address goals of care.   CODE STATUS: DNR  PPS: 30% HOSPICE ELIGIBILITY/DIAGNOSIS: TBD  PAST MEDICAL HISTORY:  Past Medical History:  Diagnosis Date  . Alzheimer's dementia (HCC)   . Anxiety   . Depression   . GERD (gastroesophageal reflux disease)   . Hypertension     SOCIAL HX:  Social History   Tobacco Use  . Smoking status: Never Smoker  . Smokeless tobacco: Never Used  Substance Use Topics  . Alcohol use: No    ALLERGIES:  Allergies  Allergen Reactions  . Haldol [Haloperidol Lactate] Hives     PERTINENT MEDICATIONS:  Outpatient Encounter Medications as of 05/17/2018  Medication Sig  . acetaminophen (TYLENOL) 500 MG tablet Take 1,000 mg by mouth daily as needed for mild pain.   . bisacodyl (DULCOLAX) 5 MG EC tablet Take 5 mg by mouth every 3 (three) days.  Marland Kitchen docusate sodium (COLACE) 100 MG capsule Take 100 mg by mouth 2 (two) times daily.  Marland Kitchen levothyroxine  (SYNTHROID, LEVOTHROID) 100 MCG tablet Take 100 mcg by mouth daily before breakfast.  . metoprolol (LOPRESSOR) 50 MG tablet Take 50 mg by mouth 2 (two) times daily.  Marland Kitchen POLYETHYLENE GLYCOL 3350 PO Take 17 g by mouth at bedtime. Mix 1 capful into 8 oz of water  . saccharomyces boulardii (FLORASTOR) 250 MG capsule Take 1 capsule (250 mg total) by mouth 2 (two) times daily.  . Skin Protectants, Misc. (MINERIN) CREA Apply 1 application topically daily. App after bathing  . sodium chloride (OCEAN) 0.65 % SOLN nasal spray Place 1 spray into both nostrils 2 (two) times daily as needed (prn dry nose).  . traMADol (ULTRAM) 50 MG tablet Take 1 tablet (50 mg total) by mouth every 6 (six) hours as needed.   No facility-administered encounter medications on file as of 05/17/2018.     PHYSICAL EXAM:   General:  frail appearing, thin elderly, cognitively impaired female Cardiovascular: regular rate and rhythm Pulmonary: clear ant fields Abdomen: soft, nontender, + bowel sounds GU: no suprapubic tenderness Extremities: no edema, no joint deformities Skin: no rashes Neurological: Weakness but otherwise nonfocal/functionally quadriplegic  Christin Prince Rome, NP

## 2018-05-20 ENCOUNTER — Other Ambulatory Visit: Payer: Self-pay

## 2018-06-26 ENCOUNTER — Other Ambulatory Visit: Payer: Self-pay

## 2018-06-26 ENCOUNTER — Non-Acute Institutional Stay: Payer: Medicare HMO | Admitting: Primary Care

## 2018-06-26 DIAGNOSIS — Z515 Encounter for palliative care: Secondary | ICD-10-CM

## 2018-06-26 NOTE — Progress Notes (Signed)
Therapist, [facility] Consult Note Telephone: [phone]  Fax: [phone]([phone]   TELEHEALTH VISIT STATEMENT Due to the COVID-19 crisis, this visit was done via telemedicine from my office. It was initiated and consented to by this patient and/or family.   PATIENT NAME: Angie Larsen Wendorff DOB: [birth year] MRN: [id number]  PRIMARY CARE PROVIDER:   Keane PoliceSlade-Hartman, Venezela, MD  REFERRING PROVIDER:  Keane PoliceSlade-Hartman, Venezela, MD [phone] [address] [place], [place] [address]  RESPONSIBLE PARTY:   Extended Emergency Contact Information Primary Emergency Contact: [facility] Address: [address]          [place], [place] [address] [place] Home Phone: [phone] Relation: Legal Guardian Secondary Emergency Contact: Sherrill RaringLoyd,Herman Address: [address]          [place], [place] [address] [place] Home Phone: [phone] Relation: Brother  Palliative Care was asked to follow patient by consultation request of Dr. Keane PoliceSlade-Hartman, Venezela, MD. This is a follow up visit.  ASSESSMENT and RECOMMENDATIONS:   1. Goals of care: DNR on record. [facility]. Patient is currently Covid negative. T/c to Javier GlazierElsie Hunter, [place], and discussed patient status. She is concerned with Covid virus outbreak. She found out on the news about the increase in cases. She wants hospitalization for reasonable treatment if needed. She states pt quality of life is poor and does not want CPR. Visit consisted of counseling and education dealing with the complex and emotionally intense issues of symptom management and palliative care in the setting of serious and potentially life-threatening illness  2. Dementia: Appears stable,  FAST score 7 c-d. Patient presented today in bed, has just gotten back to bed from sitting up for meal. Appetite and intake is good and pt is gaining wt.  Palliative care will continue to follow for goals of care clarification and symptom  management. Return 4-6 weeks or prn.  I spent 35 minutes providing this consultation,  from 1255 to 1330. More than 50% of the time in this consultation was spent coordinating communication.   HISTORY OF PRESENT ILLNESS:  Angie Larsen Ballengee is a 85 y.o. year old female with multiple medical problems including Alzheimer's Dementia, Anxiety, depression, HTN. Palliative Care was asked to help address goals of care.   CODE STATUS: DNR  PPS: 30% HOSPICE ELIGIBILITY/DIAGNOSIS: TBD  PAST MEDICAL HISTORY:  Past Medical History:  Diagnosis Date  . Alzheimer's dementia (HCC)   . Anxiety   . Depression   . GERD (gastroesophageal reflux disease)   . Hypertension     SOCIAL HX:  Social History   Tobacco Use  . Smoking status: Never Smoker  . Smokeless tobacco: Never Used  Substance Use Topics  . Alcohol use: No    ALLERGIES:  Allergies  Allergen Reactions  . Haldol [Haloperidol Lactate] Hives     PERTINENT MEDICATIONS:  Outpatient Encounter Medications as of 06/26/2018  Medication Sig  . acetaminophen (TYLENOL) 500 MG tablet Take 1,000 mg by mouth daily as needed for mild pain.   . bisacodyl (DULCOLAX) 5 MG EC tablet Take 5 mg by mouth every 3 (three) days.  Marland Kitchen. docusate sodium (COLACE) 100 MG capsule Take 100 mg by mouth 2 (two) times daily.  Marland Kitchen. levothyroxine (SYNTHROID, LEVOTHROID) 100 MCG tablet Take 100 mcg by mouth daily before breakfast.  . metoprolol (LOPRESSOR) 50 MG tablet Take 50 mg by mouth 2 (two) times daily.  Marland Kitchen. POLYETHYLENE GLYCOL 3350 PO Take 17 g by mouth at bedtime. Mix 1  capful into 8 oz of water  . saccharomyces boulardii (FLORASTOR) 250 MG capsule Take 1 capsule (250 mg total) by mouth 2 (two) times daily.  . Skin Protectants, Misc. (MINERIN) CREA Apply 1 application topically daily. App after bathing  . sodium chloride (OCEAN) 0.65 % SOLN nasal spray Place 1 spray into both nostrils 2 (two) times daily as needed (prn dry nose).  . traMADol (ULTRAM) 50 MG tablet Take 1  tablet (50 mg total) by mouth every 6 (six) hours as needed.   No facility-administered encounter medications on file as of 06/26/2018.     PHYSICAL EXAM/ROS with SNF staff:   11 lbs in 30 days gain, currently  Covid neg.  General: NAD, frail appearing, thin Cardiovascular: no chest pain reported, no edema Pulmonary: no cough,  No SOB Abdomen: bowels regular, appetite is good GU: incontinent Extremities: oob with lift, just was up and recently lay back down. Skin: no rashes no wounds Neurological: Weakness , no anxiety, advanced dementia FAST 7 c-d.  Marijo File DNP, AGPCNP-BC

## 2018-07-17 ENCOUNTER — Non-Acute Institutional Stay: Payer: Medicaid Other | Admitting: Primary Care

## 2018-07-17 ENCOUNTER — Other Ambulatory Visit: Payer: Self-pay

## 2018-07-17 DIAGNOSIS — Z515 Encounter for palliative care: Secondary | ICD-10-CM

## 2018-07-17 NOTE — Progress Notes (Signed)
 [facility] Palliative Care Consult Note Telephone: [phone]  Fax: [phone]  TELEHEALTH VISIT STATEMENT Due to the COVID-19 crisis, this visit was done via telemedicine from my office. It was initiated and consented to by this patient and/or family.  PATIENT NAME: Angie Larsen DOB: 06/11/1933 MRN: [id number]  PRIMARY CARE PROVIDER:   Alvester Morin, Laurel  REFERRING PROVIDER:  Alvester Morin, MD [phone] [address]. [place], [place] [address] [phone]  RESPONSIBLE PARTY:   Extended Emergency Contact Information Primary Emergency Contact: [facility] Address: [address]          [place], [place] [address] Johnnette Litter of [place] Phone: [phone] Relation: Legal Guardian Secondary Emergency Contact: Vernie Murders Address: [address]          [place], [place] [address] Johnnette Litter of [place] Phone: [phone] Relation: Brother  Palliative Care was asked to follow patient by consultation request of Alvester Morin, MD. This is the follow up visit.  ASSESSMENT AND RECOMMENDATIONS:   1. Goals of Care: Maximize quality of life and symptom management.  2. Symptom Management:    Constipation: Recommend Senna bid ongoing to help with periodic constipation.Has not had bm x 3 days, facility to begin bowel protocol today.   Nutrition: Continue to offer med pass, assist with feedings. Ongoing medpass, drinks all of this. Appears to be stable with weight but facility is not currently weighing patients due to Covid 19.  3. Family Supports: Family not able to visit due to covid. Before she had visitors often.   4. Cognitive / Functional decline:  No change per snf staff in subjective affect.  FAST score is 7 d-e.  5. Advanced Care Directive: DNR  Noted at facility, in [place].  6. Follow up Palliative Care Visit: Palliative care will continue to follow for goals of care clarification and symptom management.  Return 6 weeks or prn.  I spent 15 minutes providing this consultation,  from 1030 to 1045. More than 50% of the time in this consultation was spent coordinating communication.   HISTORY OF PRESENT ILLNESS:  Angie Larsen is a 85 y.o. year old female with multiple medical problems including Alzheimer's Dementia, Anxiety, depression, HTN. Palliative Care was asked to help address goals of care.   CODE STATUS: DNR  PPS: 30% HOSPICE ELIGIBILITY/DIAGNOSIS: TBD  PAST MEDICAL HISTORY:  Past Medical History:  Diagnosis Date  . Alzheimer's dementia ([place])   . Anxiety   . Depression   . GERD (gastroesophageal reflux disease)   . Hypertension     SOCIAL HX:  Social History   Tobacco Use  . Smoking status: Never Smoker  . Smokeless tobacco: Never Used  Substance Use Topics  . Alcohol use: No    ALLERGIES:  Allergies  Allergen Reactions  . Haldol [Haloperidol Lactate] Hives     PERTINENT MEDICATIONS:  Outpatient Encounter Medications as of 07/17/2018  Medication Sig  . acetaminophen (TYLENOL) 500 MG tablet Take 1,000 mg by mouth daily as needed for mild pain.   . bisacodyl (DULCOLAX) 5 MG EC tablet Take 5 mg by mouth every 3 (three) days.  Marland Kitchen docusate sodium (COLACE) 100 MG capsule Take 100 mg by mouth 2 (two) times daily.  Marland Kitchen levothyroxine (SYNTHROID, LEVOTHROID) 100 MCG tablet Take 100 mcg by mouth daily before breakfast.  . metoprolol (LOPRESSOR) 50 MG tablet Take 50 mg by mouth 2 (two) times daily.  Marland Kitchen POLYETHYLENE GLYCOL 3350 PO Take 17 g by mouth at bedtime. Mix 1  capful into 8 oz of water  . saccharomyces boulardii (FLORASTOR) 250 MG capsule Take 1 capsule (250 mg total) by mouth 2 (two) times daily.  . Skin Protectants, Misc. (MINERIN) CREA Apply 1 application topically daily. App after bathing  . sodium chloride (OCEAN) 0.65 % SOLN nasal spray Place 1 spray into both nostrils 2 (two) times daily as needed (prn dry nose).  . traMADol (ULTRAM) 50 MG tablet Take 1 tablet (50 mg  total) by mouth every 6 (six) hours as needed.   No facility-administered encounter medications on file as of 07/17/2018.     PHYSICAL EXAM/ROS:   98.5 91-18-32/72 97% room air Current and past weights: 130 lb (April) Are not weighing due to covid. General: NAD, frail appearing, thin (tall), 5'6"  Cardiovascular: no chest pain reported, no edema,  Pulmonary: no cough, no increased SOB, covid negative Abdomen: appetite fair 25-50%, endorses constipation, incontinent of bowel, med pass qid and drinks it all. GU: denies dysuria, incontinent of urine MSK:  no joint deformities Skin: no rashes or wounds reported Neurological: Weakness, dementia, FAST 7 d-e, no changes in mentation, up with a lift, staff states normal to call out for turning, does not appear to be pain. Appears tired, sleeps well generally. Hand tremor. Not able to interact for activities.  Marijo FileKathryn M  Ducre DNP, AGPCNP-BC

## 2018-07-31 ENCOUNTER — Other Ambulatory Visit (HOSPITAL_COMMUNITY): Payer: Self-pay | Admitting: Family Medicine

## 2018-08-05 LAB — NOVEL CORONAVIRUS, NAA: SARS-CoV-2, NAA: NOT DETECTED

## 2018-08-07 ENCOUNTER — Other Ambulatory Visit: Payer: Self-pay | Admitting: Family Medicine

## 2018-08-13 LAB — NOVEL CORONAVIRUS, NAA: SARS-CoV-2, NAA: NOT DETECTED

## 2018-08-28 ENCOUNTER — Other Ambulatory Visit: Payer: Self-pay

## 2018-08-28 ENCOUNTER — Non-Acute Institutional Stay: Payer: Medicaid Other | Admitting: Primary Care

## 2018-08-28 DIAGNOSIS — Z515 Encounter for palliative care: Secondary | ICD-10-CM

## 2018-08-28 NOTE — Progress Notes (Signed)
Therapist, [facility] Consult Note Telephone: [phone](336) [phone]  Fax: [phone](336) [phone]  TELEHEALTH VISIT STATEMENT Due to the COVID-19 crisis, this visit was done via telemedicine from my office. It was initiated and consented to by this patient and/or family.  PATIENT NAME: Angie FrancoMary L Larsen DOB: 05-24-1933 MRN: [id number]  PRIMARY CARE PROVIDER:   Keane PoliceSlade-Hartman, Venezela, MD [phone]  REFERRING PROVIDER:  Keane PoliceSlade-Hartman, Venezela, MD [phone] [address] [place],  [place] [address] [phone]  RESPONSIBLE PARTY:   Extended Emergency Contact Information Primary Emergency Contact: [facility]Elsie Address: [address]          [place], [place] [address] [place] Home Phone: [phone] Relation: Legal Guardian Secondary Emergency Contact: Sherrill RaringLoyd,Herman Address: [address]          [place], [place] [address] [place] Home Phone: [phone] Relation: Brother  Palliative Care was asked to follow this patient by consultation request of Keane PoliceSlade-Hartman, Venezela, MD. This is a follow up visit.  ASSESSMENT AND RECOMMENDATIONS:   1. Goals of Care: Maximize quality of life and symptom management.  2. Symptom Management:   Nutrition: Has lost 3.5 lbs since April, current weight 7/20= 126.8 lb, 3.5 wt loss. History of weights: 130 lb in April 2020, 123 lb  In Feb 2020, 118 lb in Jan 2020.; 2 years ago it was 160 lb.  Continue dietary supplements and feeding assistance. Monitor weights monthly.  Pain: Does not seem to exhibit. Caregiver present was able to also query patient as [facility]  Dementia: FAST Score of 7 b, able to sit in chair but not able to speak more than a few words.  3. Family /Caregiver/Community Supports:  No personal visits, no window visits. Staff can coordinate if family wants to visit. POA is concerned about patient not remembering who she is, encouraged to set up a window visit.  4. Cognitive / Functional decline:  Cognitively at baseline, out of bed today in chair, which is her norm. FAST 7b.  5. Advanced Care Directive: DNR in record and [facility]. POA called for any needed discussion, she is concerned for the isolation.  6. Follow up Palliative Care Visit: Palliative care will continue to follow for goals of care clarification and symptom management. Return 4-6 weeks or prn.  I spent 25 minutes providing this consultation, from 1100 to 1125. More than 50% of the time in this consultation was spent coordinating communication.   HISTORY OF PRESENT ILLNESS:  Angie Larsen is a 85 y.o. year old female with multiple medical problems including Alzheimer's Dementia, Anxiety, depression, HTN. Palliative Care was asked to help address goals of care.   CODE STATUS: DNR  PPS: 30% HOSPICE ELIGIBILITY/DIAGNOSIS: TBD  PAST MEDICAL HISTORY:  Past Medical History:  Diagnosis Date  . Alzheimer's dementia (HCC)   . Anxiety   . Depression   . GERD (gastroesophageal reflux disease)   . Hypertension     SOCIAL HX:  Social History   Tobacco Use  . Smoking status: Never Smoker  . Smokeless tobacco: Never Used  Substance Use Topics  . Alcohol use: No    ALLERGIES:  Allergies  Allergen Reactions  . Haldol [Haloperidol Lactate] Hives     PERTINENT MEDICATIONS:  Outpatient Encounter Medications as of 08/28/2018  Medication Sig  . acetaminophen (TYLENOL) 500 MG tablet Take 1,000 mg by mouth daily as needed for mild pain.   . bisacodyl (DULCOLAX) 5 MG EC tablet Take 5 mg by mouth every 3 (three)  days.  . docusate sodium (COLACE) 100 MG capsule Take 100 mg by mouth 2 (two) times daily.  Marland Kitchen levothyroxine (SYNTHROID, LEVOTHROID) 100 MCG tablet Take 100 mcg by mouth daily before breakfast.  . metoprolol (LOPRESSOR) 50 MG tablet Take 50 mg by mouth 2 (two) times daily.  Marland Kitchen POLYETHYLENE GLYCOL 3350 PO Take 17 g by mouth at bedtime. Mix 1 capful into 8 oz of water  . saccharomyces boulardii (FLORASTOR) 250 MG capsule  Take 1 capsule (250 mg total) by mouth 2 (two) times daily.  . Skin Protectants, Misc. (MINERIN) CREA Apply 1 application topically daily. App after bathing  . sodium chloride (OCEAN) 0.65 % SOLN nasal spray Place 1 spray into both nostrils 2 (two) times daily as needed (prn dry nose).  . traMADol (ULTRAM) 50 MG tablet Take 1 tablet (50 mg total) by mouth every 6 (six) hours as needed.   No facility-administered encounter medications on file as of 08/28/2018.     PHYSICAL EXAM/ROS:   Current and past weights: 130 lb in April 2020, current weight 7/20= 126.8 lb, 3.5 wt loss. General: NAD, frail appearing, thin Cardiovascular: no chest pain reported, no edema,  Pulmonary: no cough, no increased SOB, never tested covid + Abdomen: appetite fair, 25-50%, must be fed, endorses occasional constipation, incontinent of bowel GU: denies dysuria, incontinent of urine  MSK:  no joint deformities, OOB in w/c, total assistance Skin: no rashes or wounds reported Neurological: Clearence Ped, does not appear to have pain, smiles at caregiver.  Cyndia Skeeters DNP AGPCNP-BC

## 2018-10-01 ENCOUNTER — Other Ambulatory Visit: Payer: Self-pay

## 2018-10-01 ENCOUNTER — Non-Acute Institutional Stay: Payer: Medicaid Other | Admitting: Primary Care

## 2018-10-01 DIAGNOSIS — Z515 Encounter for palliative care: Secondary | ICD-10-CM

## 2018-10-01 NOTE — Progress Notes (Signed)
Designer, jewellery Palliative Care Consult Note Telephone: [phone]  Fax: [phone]  TELEHEALTH VISIT STATEMENT Due to the COVID-19 crisis, this visit was done via telemedicine from my office. It was initiated and consented to by this patient and/or family.  PATIENT NAME: Angie Larsen DOB: [birth year] MRN: [id number]  PRIMARY CARE PROVIDER:   Alvester Morin, MD, [place] [address] [phone]  REFERRING PROVIDER:  Alvester Morin, MD [place]. [place],  [place] [address] [phone]  RESPONSIBLE PARTY:   Extended Emergency Contact Information Primary Emergency Contact: [facility] Address: [address]          [place], [place] [address] Angie Larsen of [place] Phone: [phone] Relation: Legal Guardian Secondary Emergency Contact: [facility] Address: [address]          [place], [place] [address] Angie Larsen of [place] Phone: [phone] Relation: Brother   ASSESSMENT AND RECOMMENDATIONS:   1. Advance Care Planning/Goals of Care: Goals include to maximize quality of life and symptom management.Has DNR on record. SNF to do MOST but signed by SNF MD.  2. Symptom Management:   Isolation: Has a roommate now for company, has a baby doll which calms here. She has a window into courtyard; RE family visits the SNF offer sI pad visits. Chibere, Financial risk analyst is to be contacted for set up. They are also doing window visits in main dining area for those with inner windows.  Nutrition: Weight fluctuates but appears to stay around a stable average. Must be fed and given liquids.  3. Family /Caregiver/Community Supports:  T/c to POA, let her know about window visits or I pad visits. Currently is resident of SNF, with advanced dementia. POA states they are trying to get some family members together.  No concerns reported.  4. Cognitive / Functional decline:  Dementia stage 7 B  5. Follow up  Palliative Care Visit: Palliative care will continue to follow for goals of care clarification and symptom management. Return 6 weeks or prn.  I spent 25 minutes providing this consultation,  from 1150 to 1215. More than 50% of the time in this consultation was spent coordinating communication.   HISTORY OF PRESENT ILLNESS:  Angie Larsen is a 85 y.o. year old female with multiple medical problems including dementia,  Palliative Care was asked to follow this patient by consultation request of Alvester Morin* to help address advance care planning and goals of care. This is a follow up visit.  CODE STATUS: DNR  PPS: 30% HOSPICE ELIGIBILITY/DIAGNOSIS: TBD  PAST MEDICAL HISTORY:  Past Medical History:  Diagnosis Date  . Alzheimer's dementia ([place])   . Anxiety   . Depression   . GERD (gastroesophageal reflux disease)   . Hypertension     SOCIAL HX:  Social History   Tobacco Use  . Smoking status: Never Smoker  . Smokeless tobacco: Never Used  Substance Use Topics  . Alcohol use: No    ALLERGIES:  Allergies  Allergen Reactions  . Haldol [Haloperidol Lactate] Hives     PERTINENT MEDICATIONS:  Outpatient Encounter Medications as of 10/01/2018  Medication Sig  . acetaminophen (TYLENOL) 500 MG tablet Take 1,000 mg by mouth daily as needed for mild pain.   . bisacodyl (DULCOLAX) 5 MG EC tablet Take 5 mg by mouth every 3 (three) days.  Marland Kitchen docusate sodium (COLACE) 100 MG capsule Take 100 mg by mouth 2 (two) times daily.  Marland Kitchen levothyroxine (SYNTHROID, LEVOTHROID) 100 MCG tablet Take  100 mcg by mouth daily before breakfast.  . metoprolol (LOPRESSOR) 50 MG tablet Take 50 mg by mouth 2 (two) times daily.  Marland Kitchen. POLYETHYLENE GLYCOL 3350 PO Take 17 g by mouth at bedtime. Mix 1 capful into 8 oz of water  . saccharomyces boulardii (FLORASTOR) 250 MG capsule Take 1 capsule (250 mg total) by mouth 2 (two) times daily.  . Skin Protectants, Misc. (MINERIN) CREA Apply 1 application topically  daily. App after bathing  . sodium chloride (OCEAN) 0.65 % SOLN nasal spray Place 1 spray into both nostrils 2 (two) times daily as needed (prn dry nose).  . traMADol (ULTRAM) 50 MG tablet Take 1 tablet (50 mg total) by mouth every 6 (six) hours as needed.   No facility-administered encounter medications on file as of 10/01/2018.     PHYSICAL EXAM / ROS:  120/67  97.8-61-16 Current and past weights: 124.2 8/20;  current weight 7/20= 126.8 lb, 3.5 wt loss. History of weights: 130 lb in April 2020, 123 lb  In Feb 2020, 118 lb in Jan 2020.; 2 years ago it was 160 lb.  General: NAD, frail appearing, thin Cardiovascular: no chest pain reported, no edema,  Pulmonary: no cough, no increased SOB, ROOM AIR Abdomen: appetite adequate, denies constipation, incontinent of bowel GU: denies dysuria, incontinent of urine MSK:  no joint deformities, non ambulatory, no falls,  up in chair with lift most days Skin: no rashes or wounds reported Neurological: Weakness, dementia, speaks a few words. Sits in chair. FAST stage 7 b. Has a doll that calms her.  Angie MareKathryn M. Katrinka BlazingSmith DNP AGPCNP-BC

## 2018-11-12 ENCOUNTER — Non-Acute Institutional Stay: Payer: Medicaid Other | Admitting: Primary Care

## 2018-11-12 ENCOUNTER — Other Ambulatory Visit: Payer: Self-pay

## 2018-11-13 ENCOUNTER — Other Ambulatory Visit: Payer: Self-pay

## 2018-11-13 ENCOUNTER — Non-Acute Institutional Stay: Payer: Medicaid Other | Admitting: Primary Care

## 2018-11-13 DIAGNOSIS — Z515 Encounter for palliative care: Secondary | ICD-10-CM

## 2018-11-13 NOTE — Progress Notes (Signed)
Designer, jewellery Palliative Care Consult Note Telephone: [phone]  Fax: [phone]  TELEHEALTH VISIT STATEMENT Due to the COVID-19 crisis, this visit was done via telemedicine from my office. It was initiated and consented to by this patient and/or family.  PATIENT NAME: Angie Larsen [address] [place] [address] [phone] (home)  DOB: 23-Mar-1933 MRN: [id number]  PRIMARY CARE PROVIDER:   Alvester Morin, MD, [place]. [place] [address] [phone]  REFERRING PROVIDER:  Alvester Morin, MD [place]. [place],  [place] [address] [phone]  RESPONSIBLE PARTY:   Extended Emergency Contact Information Primary Emergency Contact: [facility] Address: [address]          [place], [place] [address] Johnnette Litter of [place] Phone: [phone] Relation: Legal Guardian Secondary Emergency Contact: [facility] Address: [address]          [place], [place] [address] Johnnette Litter of [place] Phone: [phone] Relation: Brother   ASSESSMENT AND RECOMMENDATIONS:   1. Advance Care Planning/Goals of Care: Goals include to maximize quality of life and symptom management.DNR in record.  2. Symptom Management:   Respiratory Status: I saw Angie Larsen today by video appointment. Nursing staff reported that this morning she  aspirated on scrambled eggs. They said she had coughed earlier and gotten a large amount of phlegm mucus and later when she aspirated on the egg . The staff had to clean the eggs from her mouth. Speech recommended to assess for etiology and perhaps review dietary consistency. She may need cueing. She does have teeth and is able to chew.   I  advised skilled nursing home staff to observe for signs and symptoms of possible pneumonia as a result of this episode. E.g. fever dyspnea change and pulse oximetry.  Patient appears with flat affect minimally interactive.  This is her baseline due to dementia  her breathing is not labored at this time.  Agitation: Dementia baseline of 7B-C fast score. She still enjoys her baby doll and recently became very distressed when someone tried to remove it to provide care. This is a comfort to her.   3. Family /Caregiver/Community Supports: Has occasional window visits from family. Lives in [facility] facility  4. Cognitive / Functional decline: Alert, at cognitive baseline. Min. Verbal, can answer yes/no. Able to do some adls.  5. Follow up Palliative Care Visit: Palliative care will continue to follow for goals of care clarification and symptom management. Return 4-6 weeks or prn.  I spent 25 minutes providing this consultation,  from 1200 to 1225. More than 50% of the time in this consultation was spent coordinating communication.   HISTORY OF PRESENT ILLNESS:  Angie Larsen is a 85 y.o. year old female with multiple medical problems including advanced dementia, aspiration, anxiety, hypothyroid,constipation. Palliative Care was asked to follow this patient by consultation request of Slade-Hartman, Ivette Loyal* to help address advance care planning and goals of care. This is a follow up visit.  CODE STATUS: DNR  PPS: 30% HOSPICE ELIGIBILITY/DIAGNOSIS: TBD  PAST MEDICAL HISTORY:  Past Medical History:  Diagnosis Date  . Alzheimer's dementia ([place])   . Anxiety   . Depression   . GERD (gastroesophageal reflux disease)   . Hypertension     SOCIAL HX:  Social History   Tobacco Use  . Smoking status: Never Smoker  . Smokeless tobacco: Never Used  Substance Use Topics  . Alcohol use: No    ALLERGIES:  Allergies  Allergen Reactions  . Haldol [  Haloperidol Lactate] Hives     PERTINENT MEDICATIONS:  Outpatient Encounter Medications as of 11/13/2018  Medication Sig  . acetaminophen (TYLENOL) 500 MG tablet Take 1,000 mg by mouth daily as needed for mild pain.   . bisacodyl (DULCOLAX) 5 MG EC tablet Take 5 mg by mouth every 3 (three) days.  Marland Kitchen docusate  sodium (COLACE) 100 MG capsule Take 100 mg by mouth 2 (two) times daily.  Marland Kitchen levothyroxine (SYNTHROID, LEVOTHROID) 100 MCG tablet Take 100 mcg by mouth daily before breakfast.  . metoprolol (LOPRESSOR) 50 MG tablet Take 50 mg by mouth 2 (two) times daily.  Marland Kitchen POLYETHYLENE GLYCOL 3350 PO Take 17 g by mouth at bedtime. Mix 1 capful into 8 oz of water  . saccharomyces boulardii (FLORASTOR) 250 MG capsule Take 1 capsule (250 mg total) by mouth 2 (two) times daily.  . Skin Protectants, Misc. (MINERIN) CREA Apply 1 application topically daily. App after bathing  . sodium chloride (OCEAN) 0.65 % SOLN nasal spray Place 1 spray into both nostrils 2 (two) times daily as needed (prn dry nose).  . traMADol (ULTRAM) 50 MG tablet Take 1 tablet (50 mg total) by mouth every 6 (six) hours as needed.   No facility-administered encounter medications on file as of 11/13/2018.     PHYSICAL EXAM / ROS:   Her vital signs were 97.6 -99 -18 139/66. 94% oxygenation on room air. Current and past weights:  Current weight 129 lbs. 124.2 8/20;  7/20= 126.8 lb, 3.5 wt loss.History of weights:130 lb in April 2020,123 lb In Feb 2020,118 lb in Jan 2020.;2 years ago it was 160 lb. General: NAD, frail appearing, thin Cardiovascular: no chest pain reported, no edema Pulmonary: no cough, no increased SOB, no cyanosis or respiratory effort, no dyspnea Abdomen: appetite fair, endorses constipation, incontinent of bowel GU: denies dysuria, incontinent of urine MSK:  no joint deformities, non ambulatory Skin: no rashes or wounds reported Neurological: Weakness, Advanced dementia FAST 7 a-b.  Marijo File DNP AGPCNP-BC

## 2018-11-15 ENCOUNTER — Non-Acute Institutional Stay: Payer: Medicare HMO | Admitting: Primary Care

## 2018-12-20 ENCOUNTER — Non-Acute Institutional Stay: Payer: Medicaid Other | Admitting: Primary Care

## 2018-12-20 ENCOUNTER — Other Ambulatory Visit: Payer: Self-pay

## 2018-12-20 DIAGNOSIS — Z515 Encounter for palliative care: Secondary | ICD-10-CM

## 2018-12-20 NOTE — Progress Notes (Signed)
Therapist, nutritional Palliative Care Consult Note Telephone: [phone]  Fax: [phone]  TELEHEALTH VISIT STATEMENT Due to the COVID-19 crisis, this visit was done via telemedicine from my office. It was initiated and consented to by this patient and/or family.  PATIENT NAME: Angie Larsen [address] [place] [address] [phone] (home)  DOB: March 07, 1933 MRN: [id number]  PRIMARY CARE PROVIDER:   Keane Police, MD, [phone] Angie [place] [address] [phone]  REFERRING PROVIDER:  Keane Police, MD [phone] [place],  [place] [address] [phone]  RESPONSIBLE PARTY:   Extended Emergency Contact Information Primary Emergency Contact: Angie Larsen Address: [address]          [place], [place] [address] Darden Amber of [place] Home Phone: [phone] Relation: Legal Guardian Secondary Emergency Contact: [facility] Address: [address]          [place], [place] [address] Darden Amber of [place] Home Phone: [phone] Relation: Brother   ASSESSMENT AND RECOMMENDATIONS:   1. Advance Care Planning/Goals of Care: Goals include to maximize quality of life and symptom management. DNR on record.POA is sister Angie Larsen. T/c to POA but phone rang without ability to leave message.  2. Symptom Management:   Nutrition: Eats 50%, no more trouble swallowing.  Appears to be maintaining weight.  Respiratory: Never had covid, no s/sx pneumonia. No dypsnea or labor in breathing.  Dementia: At baseline, was able to speak a few words when told her birthday was tomorrow.  3. Family /Caregiver/Community Supports:  POA is Angie Larsen, her sister. She has no children. I was not able to reach POA. Lives in LTC.  4. Cognitive / Functional decline: Alert oriented x 1, dependent in all I adls and adls.  5. Follow up Palliative Care Visit: Palliative care will continue to follow for goals of care clarification and symptom management. Return  4-6 weeks or prn.  I spent 25 minutes providing this consultation,  from 1030 to 1055. More than 50% of the time in this consultation was spent coordinating communication.   HISTORY OF PRESENT ILLNESS:  Angie Larsen is a 85 y.o. year old female with multiple medical problems including Alzheimers dementia. Palliative Care was asked to follow this patient by consultation request of Slade-Hartman, Alvino Chapel* to help address advance care planning and goals of care. This is a follow up visit.  CODE STATUS: DNR  PPS: 30% HOSPICE ELIGIBILITY/DIAGNOSIS: TBD  PAST MEDICAL HISTORY:  Past Medical History:  Diagnosis Date  . Alzheimer's dementia ([facility])   . Anxiety   . Depression   . GERD (gastroesophageal reflux disease)   . Hypertension     SOCIAL HX:  Social History   Tobacco Use  . Smoking status: Never Smoker  . Smokeless tobacco: Never Used  Substance Use Topics  . Alcohol use: No    ALLERGIES:  Allergies  Allergen Reactions  . Haldol [Haloperidol Lactate] Hives     PERTINENT MEDICATIONS:  Outpatient Encounter Medications as of 12/20/2018  Medication Sig  . acetaminophen (TYLENOL) 500 MG tablet Take 1,000 mg by mouth daily as needed for mild pain.   . bisacodyl (DULCOLAX) 5 MG EC tablet Take 5 mg by mouth every 3 (three) days.  Marland Kitchen docusate sodium (COLACE) 100 MG capsule Take 100 mg by mouth 2 (two) times daily.  Marland Kitchen levothyroxine (SYNTHROID, LEVOTHROID) 100 MCG tablet Take 100 mcg by mouth daily before breakfast.  . metoprolol (LOPRESSOR) 50 MG tablet Take 50 mg by mouth 2 (two) times daily.  Marland Kitchen  POLYETHYLENE GLYCOL 3350 PO Take 17 g by mouth at bedtime. Mix 1 capful into 8 oz of water  . saccharomyces boulardii (FLORASTOR) 250 MG capsule Take 1 capsule (250 mg total) by mouth 2 (two) times daily.  . Skin Protectants, Misc. (MINERIN) CREA Apply 1 application topically daily. App after bathing  . sodium chloride (OCEAN) 0.65 % SOLN nasal spray Place 1 spray into both nostrils 2 (two)  times daily as needed (prn dry nose).  . traMADol (ULTRAM) 50 MG tablet Take 1 tablet (50 mg total) by mouth every 6 (six) hours as needed.   No facility-administered encounter medications on file as of 12/20/2018.     PHYSICAL EXAM / ROS:   Current and past weights: 126.4 lbs, has gained 7 lbs since March. General: NAD, frail appearing, thin Cardiovascular: no chest pain reported, no edema Pulmonary: no cough, no increased SOB Abdomen: appetite fair, refuses assistance in meals. Denies constipation, incontinent of bowel GU: denies dysuria, incontinent of urine MSK:  no joint deformities, non ambulatory, gets oob daily and doing restorative treatment. Skin: no rashes or wounds reported Neurological: Weakness, Dementia and calmed with baby doll.  Jason Coop, NP

## 2019-02-25 ENCOUNTER — Non-Acute Institutional Stay: Payer: Medicaid Other | Admitting: Primary Care

## 2019-02-25 ENCOUNTER — Other Ambulatory Visit: Payer: Self-pay

## 2019-02-25 DIAGNOSIS — Z515 Encounter for palliative care: Secondary | ICD-10-CM

## 2019-02-25 NOTE — Progress Notes (Signed)
Therapist, nutritional Palliative Care Consult Note Telephone: [phone]  Fax: [phone]  TELEHEALTH VISIT STATEMENT Due to the COVID-19 crisis, this visit was done via telemedicine from my office. It was initiated and consented to by this patient and/or family.  PATIENT NAME: Angie Larsen [address] [place] [address] [phone] (home)  DOB: 1980-11-07 MRN: [id number]  PRIMARY CARE PROVIDER:   Keane Police, MD, [phone] [address] [place] [address] [phone]  REFERRING PROVIDER:  Keane Police, MD [phone] [place],  [place] [address] [phone]  RESPONSIBLE PARTY:   Extended Emergency Contact Information Primary Emergency Contact: [facility] Address: [address]          [place], [place] [address] Darden Amber of [place] Home Phone: [phone] Relation: Legal Guardian Secondary Emergency Contact: [facility] Address: [address]          [place], [place] [address] Darden Amber of [place] Home Phone: [phone] Relation: Brother   ASSESSMENT AND RECOMMENDATIONS:   1. Advance Care Planning/Goals of Care: Goals include to maximize quality of life and symptom management. DNR on record. Sister is POA.  2. Symptom Management:   Nutrition: Eating enough to gain weight, on med pass bid, stable in low 130's. Teeth in front missing, would she benefit from partial plate? Gaining weight however with intake.   Dementia progression: FAST score 7B. Spoke on interview but not clear. At baseline.  3. Family /Caregiver/Community Supports:  POA is Stark Klein, her sister. She has no children. Family does not visit due to no window access. Lives in LTC.  4. Cognitive / Functional decline: At cognitive baseline. Needs help with all iadls, wont' allow assistance with staff feeding her. Able to eat enough to gain weight.  5. Follow up Palliative Care Visit: Palliative care will continue to follow for goals of care clarification and  symptom management. Return 6-8 weeks or prn.  I spent 25 minutes providing this consultation,  from 1135 to 1200. More than 50% of the time in this consultation was spent coordinating communication.   HISTORY OF PRESENT ILLNESS:  Angie Larsen is a 86 y.o. year old female with multiple medical problems including Alzheimers dementia. Palliative Care was asked to follow this patient by consultation request of Slade-Hartman, Alvino Chapel* to help address advance care planning and goals of care. This is a follow up visit.  CODE STATUS: DNR  PPS: 30% HOSPICE ELIGIBILITY/DIAGNOSIS: No  PAST MEDICAL HISTORY:  Past Medical History:  Diagnosis Date  . Alzheimer's dementia ([facility])   . Anxiety   . Depression   . GERD (gastroesophageal reflux disease)   . Hypertension     SOCIAL HX:  Social History   Tobacco Use  . Smoking status: Never Smoker  . Smokeless tobacco: Never Used  Substance Use Topics  . Alcohol use: No    ALLERGIES:  Allergies  Allergen Reactions  . Haldol [Haloperidol Lactate] Hives     PERTINENT MEDICATIONS:  Outpatient Encounter Medications as of 84/19/2021  Medication Sig  . acetaminophen (TYLENOL) 500 MG tablet Take 1,000 mg by mouth daily as needed for mild pain.   . bisacodyl (DULCOLAX) 5 MG EC tablet Take 5 mg by mouth every 3 (three) days.  Marland Kitchen docusate sodium (COLACE) 100 MG capsule Take 100 mg by mouth 2 (two) times daily.  Marland Kitchen levothyroxine (SYNTHROID, LEVOTHROID) 100 MCG tablet Take 100 mcg by mouth daily before breakfast.  . metoprolol (LOPRESSOR) 50 MG tablet Take 50 mg by mouth 2 (two) times daily.  Marland Kitchen  POLYETHYLENE GLYCOL 3350 PO Take 17 g by mouth at bedtime. Mix 1 capful into 8 oz of water  . saccharomyces boulardii (FLORASTOR) 250 MG capsule Take 1 capsule (250 mg total) by mouth 2 (two) times daily.  . Skin Protectants, Misc. (MINERIN) CREA Apply 1 application topically daily. App after bathing  . sodium chloride (OCEAN) 0.65 % SOLN nasal spray Place 1 spray  into both nostrils 2 (two) times daily as needed (prn dry nose).  . traMADol (ULTRAM) 50 MG tablet Take 1 tablet (50 mg total) by mouth every 6 (six) hours as needed.   No facility-administered encounter medications on file as of 84/19/2021.    PHYSICAL EXAM / ROS:  987.7-77-16-132/78    Current and past weights: 131.8 lbs., 126.4 in November General: NAD, frail appearing, thin Cardiovascular: no chest pain reported, no edema   Pulmonary: no cough, no increased SOB, Oxygen 96% RA  Abdomen: appetite good, denies constipation, incontinent of bowel GU: denies dysuria, incontinent of urine MSK:  no joint deformities, non ambulatory, up in chair Skin: no rashes or wounds reported Neurological: Weakness, advanced dementia FAST 7B  Jason Coop, NP  [facility]

## 2019-02-26 ENCOUNTER — Non-Acute Institutional Stay: Payer: Medicare HMO | Admitting: Primary Care

## 2019-05-14 ENCOUNTER — Other Ambulatory Visit: Payer: Self-pay

## 2019-05-14 ENCOUNTER — Non-Acute Institutional Stay: Payer: Medicaid Other | Admitting: Primary Care

## 2019-05-14 DIAGNOSIS — Z515 Encounter for palliative care: Secondary | ICD-10-CM

## 2019-05-14 NOTE — Progress Notes (Signed)
Therapist, nutritional Palliative Care Consult Note Telephone: [phone]  Fax: [phone]    PATIENT NAME: Angie Larsen [address] [place] [address] [phone] (home)  DOB: 1933/03/28 MRN: [id number]  PRIMARY CARE PROVIDER:   Keane Police, MD, [phone] [address] [place] [address] [phone]  REFERRING PROVIDER:  Keane Police, MD [phone] [place],  [place] [address] [phone]  RESPONSIBLE PARTY:   Extended Emergency Contact Information Primary Emergency Contact: [facility] Address: [address]          [place], [place] [address] Darden Amber of [place] Home Phone: [phone] Relation: Legal Guardian Secondary Emergency Contact: [facility] Address: [address]          [place], [place] [address] Darden Amber of [place] Home Phone: [phone] Relation: Brother   ASSESSMENT AND RECOMMENDATIONS:   1. Advance Care Planning/Goals of Care: Goals include to maximize quality of life and symptom management. DNR on record.   2. Symptom Management:   Nutrition: Wt fluctuating, won't allow anyone to feed her and has poor intake. Tries to feed baby.   Overall appears comfortable and thriving. Has been to beauty shop and has a new hair style. She is smiling and positive today.  3. Family /Caregiver/Community Supports: Stark Klein is sister, involved in her care  4. Cognitive / Functional decline: Stable in her current cognitive and functional level. Has a doll which calms any agitation.  5. Follow up Palliative Care Visit: Palliative care will continue to follow for goals of care clarification and symptom management. Return 6-8 weeks or prn.  I spent 15 minutes providing this consultation,  from 1100 to 1115. More than 50% of the time in this consultation was spent coordinating communication.   HISTORY OF PRESENT ILLNESS:  Angie Larsen is a 86 y.o. year old female with multiple medical problems including dementia,  anxiety, HTN. Palliative Care was asked to follow this patient by consultation request of Slade-Hartman, Alvino Chapel* to help address advance care planning and goals of care. This is a follow up visit.  CODE STATUS: DNR  PPS: 30% HOSPICE ELIGIBILITY/DIAGNOSIS: TBD  PAST MEDICAL HISTORY:  Past Medical History:  Diagnosis Date   Alzheimer's dementia (HCC)    Anxiety    Depression    GERD (gastroesophageal reflux disease)    Hypertension     SOCIAL HX:  Social History   Tobacco Use   Smoking status: Never Smoker   Smokeless tobacco: Never Used  Substance Use Topics   Alcohol use: No    ALLERGIES:  Allergies  Allergen Reactions   Haldol [Haloperidol Lactate] Hives     PERTINENT MEDICATIONS:  Outpatient Encounter Medications as of 05/14/2019  Medication Sig   acetaminophen (TYLENOL) 325 MG tablet Take 650 mg by mouth every 4 (four) hours as needed for mild pain or fever.    guaiFENesin (ROBITUSSIN) 100 MG/5ML SOLN Take 5 mLs by mouth every 4 (four) hours as needed for cough or to loosen phlegm.   levothyroxine (SYNTHROID, LEVOTHROID) 100 MCG tablet Take 100 mcg by mouth daily before breakfast.   metoprolol (LOPRESSOR) 50 MG tablet Take 50 mg by mouth 2 (two) times daily.   POLYETHYLENE GLYCOL 3350 PO Take 17 g by mouth at bedtime. Mix 1 capful into 8 oz of water   saccharomyces boulardii (FLORASTOR) 250 MG capsule Take 1 capsule (250 mg total) by mouth 2 (two) times daily.   senna-docusate (SENOKOT-S) 8.6-50 MG tablet Take 1 tablet by mouth daily.   [DISCONTINUED] bisacodyl (  DULCOLAX) 5 MG EC tablet Take 5 mg by mouth every 3 (three) days.   [DISCONTINUED] docusate sodium (COLACE) 100 MG capsule Take 100 mg by mouth 2 (two) times daily.   [DISCONTINUED] Skin Protectants, Misc. (MINERIN) CREA Apply 1 application topically daily. App after bathing   [DISCONTINUED] sodium chloride (OCEAN) 0.65 % SOLN nasal spray Place 1 spray into both nostrils 2 (two) times daily  as needed (prn dry nose).   [DISCONTINUED] traMADol (ULTRAM) 50 MG tablet Take 1 tablet (50 mg total) by mouth every 6 (six) hours as needed.   No facility-administered encounter medications on file as of 05/14/2019.   PHYSICAL EXAM / ROS:   Current and past weights: 129.6 lbs April 21, 131.8 lbs in Jan 21., 126.4 in November General: NAD, frail appearing, thin Cardiovascular: no chest pain reported, no edema Pulmonary: no cough, no increased SOB Abdomen: appetite fair to good, endorses occ constipation, incontinent of bowel GU: denies dysuria, incontinent of urine MSK:  no joint deformities, non ambulatory Skin: no rashes or wounds reported Neurological: Weakness, advanced dementia  Jason Coop, NP [facility]  COVID-19 PATIENT SCREENING TOOL  Person answering questions: ____________Staff_______ _____   1.  Is the patient or any family member in the home showing any signs or symptoms regarding respiratory infection?               Person with Symptom- __________NA_________________  a. Fever                                                                          Yes___ No___          ___________________  b. Shortness of breath                                                    Yes___ No___          ___________________ c. Cough/congestion                                       Yes___  No___         ___________________ d. Body aches/pains                                                         Yes___ No___        ____________________ e. Gastrointestinal symptoms (diarrhea, nausea)           Yes___ No___        ____________________  2. Within the past 14 days, has anyone living in the home had any contact with someone with or under investigation for COVID-19?    Yes___ No_X_   Person __________________

## 2019-07-09 ENCOUNTER — Non-Acute Institutional Stay: Payer: Medicare Other | Admitting: Primary Care

## 2019-07-09 ENCOUNTER — Other Ambulatory Visit: Payer: Self-pay

## 2019-07-09 DIAGNOSIS — Z515 Encounter for palliative care: Secondary | ICD-10-CM

## 2019-07-09 DIAGNOSIS — R413 Other amnesia: Secondary | ICD-10-CM

## 2019-07-09 NOTE — Progress Notes (Signed)
[facility] Consult Note Telephone: [phone]  Fax: [phone]  PATIENT NAME: Angie Larsen [address] [place] [address] [phone] (home)  DOB: [birth year] MRN: [id number]  PRIMARY CARE PROVIDER:    Alvester Morin, MD,  [place] [place] [place] [address] [phone]  REFERRING PROVIDER:   Alvester Morin, MD [address] [place],  [place] [address] [phone]  RESPONSIBLE PARTY:   Extended Emergency Contact Information Primary Emergency Contact: [facility] Address: [address]          [place], [place] [address] Johnnette Litter of [place] Phone: [phone] Relation: Legal Guardian Secondary Emergency Contact: Vernie Murders Address: [address]          [place], [place] [address] Johnnette Litter of [place] Phone: [phone] Relation: Brother    I met with patient in the facility.  ASSESSMENT AND RECOMMENDATIONS:   1. Advance Care Planning/Goals of Care: Goals include to maximize quality of life and symptom management.  MOST form on chart, uploaded to chart.   2. Symptom Management:   Agitation:  Often has with care giving. Recommend zoloft 25 mg daily for anxiety,and  acetaminophen 500 mg TID po for chronic pain management.   I met with Angie Larsen in her nursing home room. She is able to verbalize but most of the words are nonsensical. She was able to say yes or no but not clear if it was of  meaning. She appeared comfortable. She had had  her lunch and was staying up afterward. Staff reports that she often needs help to eat and gets agitated if staff try to assist. I would suggest meal time feeding and diet supplements as she has lost a little bit of weight. All medications were reviewed with staff.   I attempted to call Delano Metz her POA. The phone rang and then it seemed as if there was a hangup without any ability to leave a message. I will continue to follow every 2 to 3  months and as needed.  3. Family /Caregiver/Community Supports: Has sister as Designer, industrial/product, lives in [place].  4. Cognitive / Functional decline: A and O x 1. Attempts verbalization but expressive dysphagia. Can sit in chair.  5. Follow up Palliative Care Visit: Palliative care will continue to follow for goals of care clarification and symptom management. Return 12 weeks or prn.  I spent 35 minutes providing this consultation,  from 1000 to 1035. More than 50% of the time in this consultation was spent coordinating communication.   HISTORY OF PRESENT ILLNESS:  Angie Larsen is a 86 y.o. year old female with multiple medical problems including dementia with behavior disturbances. Palliative Care was asked to follow this patient by consultation request of Slade-Hartman, Ivette Loyal* to help address advance care planning and goals of care. This is a follow up visit.  CODE STATUS: DNR  PPS: 30% HOSPICE ELIGIBILITY/DIAGNOSIS: TBD  PAST MEDICAL HISTORY:  Past Medical History:  Diagnosis Date  . Alzheimer's dementia ([place])   . Anxiety   . Depression   . GERD (gastroesophageal reflux disease)   . Hypertension     SOCIAL HX:  Social History   Tobacco Use  . Smoking status: Never Smoker  . Smokeless tobacco: Never Used  Substance Use Topics  . Alcohol use: No    ALLERGIES:  Allergies  Allergen Reactions  . Haldol [Haloperidol Lactate] Hives     PERTINENT MEDICATIONS:  Outpatient Encounter Medications as of 07/09/2019  Medication Sig  .  acetaminophen (TYLENOL) 325 MG tablet Take 650 mg by mouth every 4 (four) hours as needed for mild pain or fever.   . levothyroxine (SYNTHROID, LEVOTHROID) 100 MCG tablet Take 100 mcg by mouth daily before breakfast.  . metoprolol (LOPRESSOR) 50 MG tablet Take 50 mg by mouth 2 (two) times daily.  Marland Kitchen POLYETHYLENE GLYCOL 3350 PO Take 17 g by mouth at bedtime. Mix 1 capful into 8 oz of water  . saccharomyces boulardii (FLORASTOR) 250 MG capsule Take 1 capsule (250 mg  total) by mouth 2 (two) times daily.  Marland Kitchen senna-docusate (SENOKOT-S) 8.6-50 MG tablet Take 1 tablet by mouth daily.  . [DISCONTINUED] guaiFENesin (ROBITUSSIN) 100 MG/5ML SOLN Take 5 mLs by mouth every 4 (four) hours as needed for cough or to loosen phlegm.   No facility-administered encounter medications on file as of 07/09/2019.   PHYSICAL EXAM / ROS:   Current and past weights: 129.6 lbs now, 131.8 Lbs in Jan 2021 General: NAD, frail appearing, thin Cardiovascular: no chest pain reported, no edema  Pulmonary: no cough, no increased SOB, room air Abdomen: appetite fair, requires feeding, denies  constipation, incontinent of bowel GU: denies dysuria, incontinent of urine MSK:  no joint and ROM abnormalities, non ambulatory, oob in chair, no falls. Skin: no rashes or wounds reported Neurological: Weakness, advanced dementia FAST Score 7 C, has doll for comfort. Resists adls care. H/o anxiety disorder  Jason Coop, NP [facility]  COVID-19 PATIENT SCREENING TOOL  Person answering questions: ____________Staff_______ _____   1.  Is the patient or any family member in the home showing any signs or symptoms regarding respiratory infection?               Person with Symptom- __________NA_________________  a. Fever                                                                          Yes___ No___          ___________________  b. Shortness of breath                                                    Yes___ No___          ___________________ c. Cough/congestion                                       Yes___  No___         ___________________ d. Body aches/pains                                                         Yes___ No___        ____________________ e. Gastrointestinal symptoms (diarrhea, nausea)           Yes___ No___        ____________________  2. Within the past 14 days, has anyone living in the home had any contact with someone with or under investigation for COVID-19?      Yes___ No_X_   Person __________________

## 2019-10-24 ENCOUNTER — Non-Acute Institutional Stay: Payer: Medicare Other | Admitting: Primary Care

## 2019-10-24 ENCOUNTER — Other Ambulatory Visit: Payer: Self-pay

## 2019-10-24 DIAGNOSIS — R1310 Dysphagia, unspecified: Secondary | ICD-10-CM

## 2019-10-24 DIAGNOSIS — Z515 Encounter for palliative care: Secondary | ICD-10-CM

## 2019-10-24 DIAGNOSIS — R413 Other amnesia: Secondary | ICD-10-CM

## 2019-10-24 NOTE — Progress Notes (Signed)
[facility]er, [facility]ell[facility] Palliative Care Consult Note Telephone: [phone]  Fax: [phone]  PATIENT NAME: Angie Larsen [address] [place] [address] [phone] (home)  DOB: [birth year] MRN: [id number]  PRIMARY CARE PROVIDER:    Gennie Alma, MD,  [place] [address] [phone]  REFERRING PROVIDER:    Gennie Larsen, [place],  [place] [address]   RESPONSIBLE PARTY:   Extended Emergency Contact Information Primary Emergency Contact: Angie Larsen Address: [address]          [place], [place] [address] Angie Larsen of [place] Phone: [phone] Relation: Legal Guardian Secondary Emergency Contact: Angie Larsen Address: [address]          [place], [place] [address] Angie Larsen of [place] Phone: [phone] Relation: Brother  I met face to face with patient in the facility.  ASSESSMENT AND RECOMMENDATIONS:   1. Advance Care Planning/Goals of Care: Goals include to maximize quality of life and symptom management. DNR on file. T/c to Mrs Angie Larsen, [place]. POA informs this Probation officer pt was dx with covid this afternoon.   2. Symptom Management:   Patient appeared talkative, in NAD. No cough, not able to report sx. She is usually non interactive but today regarded me and spoke.  After visit today,  POA states she was tested positive for covid.   Meds were reviewed. Recommend continued assistance with meals and nutritional supplements.  3. Follow up Palliative Care Visit: Palliative care will continue to follow for goals of care clarification and symptom management. Return 4 weeks or prn.  4. Family /Caregiver/Community Supports:  POA is sister Angie Larsen. Lives in [place].  5. Cognitive / Functional decline: A and O x 1, dependent in all adls.   I spent 25 minutes providing this consultation,  from 1400 to 1425. More than 50% of the time in this consultation was spent coordinating communication.   CHIEF COMPLAINT:  dementia decline, new covid infection  HISTORY OF PRESENT ILLNESS:  Angie Larsen is a 86 y.o. year old female with multiple medical problems including advanced alzheimers dementia, HTH, hypothyroid, debility and immobility. Palliative Care was asked to follow this patient by consultation request of Angie Alma, MD  to help address advance care planning and goals of care. This is a follow up visit.  CODE STATUS: DNR  PPS: 30%  HOSPICE ELIGIBILITY/DIAGNOSIS: TBD  PAST MEDICAL HISTORY:  Past Medical History:  Diagnosis Date  . Alzheimer's dementia ([place])   . Anxiety   . Depression   . GERD (gastroesophageal reflux disease)   . Hypertension     SOCIAL HX:  Social History   Tobacco Use  . Smoking status: Never Smoker  . Smokeless tobacco: Never Used  Substance Use Topics  . Alcohol use: No   FAMILY HX: No family history on file.  ALLERGIES:  Allergies  Allergen Reactions  . Haldol [Haloperidol Lactate] Hives     PERTINENT MEDICATIONS:  Outpatient Encounter Medications as of 10/24/2019  Medication Sig  . acetaminophen (TYLENOL) 325 MG tablet Take 650 mg by mouth every 4 (four) hours as needed for mild pain or fever.   . levothyroxine (SYNTHROID, LEVOTHROID) 100 MCG tablet Take 100 mcg by mouth daily before breakfast.  . metoprolol (LOPRESSOR) 50 MG tablet Take 50 mg by mouth 2 (two) times daily.  Marland Kitchen POLYETHYLENE GLYCOL 3350 PO Take 17 g by mouth at bedtime. Larsen 1 capful into 8 oz of water  . saccharomyces boulardii (FLORASTOR) 250  MG capsule Take 1 capsule (250 mg total) by mouth 2 (two) times daily.  Marland Kitchen senna-docusate (SENOKOT-S) 8.6-50 MG tablet Take 1 tablet by mouth daily.   No facility-administered encounter medications on file as of 10/24/2019.    PHYSICAL EXAM / ROS:   Current and past weights: unavailable General: NAD, frail appearing, thin Cardiovascular:S1S2,  no chest pain reported, no LE edema  Pulmonary: no cough, no increased SOB, room air Abdomen: BS +,  appetite fair, endorses  occ constipation, incontinent of bowel GU: denies dysuria, incontinent of urine MSK: ++o joint and ROM abnormalities, bedbound Skin: no rashes or wounds reported Neurological: Weakness, FAST score 7c, PAINAD 0/10.  Angie Coop, NP , DNP, MPH, [facility]  COVID-19 PATIENT SCREENING TOOL  Person answering questions: ____________staff______ _____   1.  Is the patient or any family member in the home showing any signs or symptoms regarding respiratory infection?               Person with Symptom- __________NA_________________  a. Fever                                                                          Yes___ No___          ___________________  b. Shortness of breath                                                    Yes___ No___          ___________________ c. Cough/congestion                                       Yes___  No___         ___________________ d. Body aches/pains                                                         Yes___ No___        ____________________ e. Gastrointestinal symptoms (diarrhea, nausea)           Yes___ No___        ____________________  2. Within the past 14 days, has anyone living in the home had any contact with someone with or under investigation for COVID-19?    Yes___ No_X_   Person __________________

## 2019-11-21 ENCOUNTER — Non-Acute Institutional Stay: Payer: Medicare Other | Admitting: Primary Care

## 2019-11-21 ENCOUNTER — Other Ambulatory Visit: Payer: Self-pay

## 2019-11-21 DIAGNOSIS — F32A Depression, unspecified: Secondary | ICD-10-CM | POA: Insufficient documentation

## 2019-11-21 DIAGNOSIS — I10 Essential (primary) hypertension: Secondary | ICD-10-CM | POA: Insufficient documentation

## 2019-11-21 DIAGNOSIS — R413 Other amnesia: Secondary | ICD-10-CM

## 2019-11-21 DIAGNOSIS — Z515 Encounter for palliative care: Secondary | ICD-10-CM

## 2019-11-21 DIAGNOSIS — R1310 Dysphagia, unspecified: Secondary | ICD-10-CM

## 2019-11-21 DIAGNOSIS — K219 Gastro-esophageal reflux disease without esophagitis: Secondary | ICD-10-CM | POA: Insufficient documentation

## 2019-11-21 DIAGNOSIS — F0280 Dementia in other diseases classified elsewhere without behavioral disturbance: Secondary | ICD-10-CM | POA: Insufficient documentation

## 2019-11-21 NOTE — Progress Notes (Signed)
[facility] Consult Note Telephone: [phone]  Fax: [phone]  PATIENT NAME: Angie Larsen [address] [place] [address] [phone] (home)  DOB: Feb 23, 1933 MRN: [id number]  PRIMARY CARE PROVIDER:    Gennie Alma, MD,  [place] [address] [place]:   Gennie Alma, [place],  [place] [address] [phone]  RESPONSIBLE PARTY:   Extended Emergency Contact Information Primary Emergency Contact: [facility] Address: [address]          [place], [place] [address] Johnnette Litter of [place] Phone: [phone] Relation: Legal Guardian Secondary Emergency Contact: Vernie Murders Address: [address]          [place], [place] [address] Johnnette Litter of [place] Phone: [phone] Relation: Brother  I met face to face with patient in the facility.   ASSESSMENT AND RECOMMENDATIONS:   1. Advance Care Planning/Goals of Care: Goals include to maximize quality of life and symptom management. DNR on file, no changes in care goals per discussion with POA several weeks ago.  2. Symptom Management:   I met with Ms Billinger in her nursing home room. She had been diagnosed with Covid 19  3 to 4 weeks ago. She has gone to isolation and has returned to the population. She shows no symptoms or sequelae on gross exam. She is actually more active and  interactive than prior. Today she is in her wheelchair and is enjoying mobilizing her self in the hallways. Her appetite is good and her weights have been 128.4 lbs, stable for her. She denies pain and other discomfort. Her advance care plan remains DNR.   3. Follow up Palliative Care Visit: Palliative care will continue to follow for goals of care clarification and symptom management. Return 8-12 weeks or prn.  4. Family /Caregiver/Community Supports: Sister is POA, lives in [place]  5. Cognitive / Functional decline:  A and O x 1. Able  to sit up and propel in hall. Can assist to feed self.   I spent 25 minutes providing this consultation,  from 1115 to 1140. More than 50% of the time in this consultation was spent coordinating communication.   CHIEF COMPLAINT: debility, dementia  HISTORY OF PRESENT ILLNESS:  Angie Larsen is a 86 y.o. year old female  with debility, dementia. We are asked to consult around debility disease process  and goals of care.    Palliative Care was asked to follow this patient by consultation request of Gennie Alma, MD to help address advance care planning and goals of care. This is a follow up visit.  CODE STATUS: DNR  PPS: 40% HOSPICE ELIGIBILITY/DIAGNOSIS: no  ROS reported by staff  General: NAD Pulmonary: denies  cough, denies increased SOB,  Abdomen: endorses good appetite, endorses  occ constipation, endorses incontinence of bowel GU: denies dysuria, endorses incontinence of urine MSK:  endorses ROM limitations, no falls reported, oob to chair Skin: denies rashes or wounds Neurological: endorses weakness, denies pain, denies insomnia Psych: Endorses positive mood   Physical Exam:  Current and past weights:128.4 lbs, stable Constitutional:  NAD General :frail appearing, thin EYES: anicteric sclera, lids intact, no discharge  ENMT: intact hearing,oral mucous membranes moist, dentition missing in places CV:  RRR, no LE edema Pulmonary:  no increased work of breathing, no cough, no audible wheezes, room air Abdomen: intake 50%, no ascites GU: deferred MSK: moderate sacropenia, decreased ROM in all extremities, non ambulatory, in w/c Skin:  warm and dry, no rashes or wounds on visible skin Neuro: Weakness, severe cognitive impairment, grossly non -focal Psych: non -anxious affect, A and O x 1   CURRENT PROBLEM LIST:  Patient Active Problem List   Diagnosis Date Noted  . Alzheimer's dementia ([place]) 11/21/2019  . Depression 11/21/2019  . Hypertension 11/21/2019  . GERD  (gastroesophageal reflux disease) 11/21/2019  . Palliative care encounter 03/15/2018  . Dysphagia 03/15/2018  . Memory deficits 03/15/2018  . Sepsis ([place]) 02/17/2016    PAST MEDICAL HISTORY:  Past Medical History:  Diagnosis Date  . Alzheimer's dementia ([place])   . Anxiety   . Depression   . GERD (gastroesophageal reflux disease)   . Hypertension     SOCIAL HX:  Social History   Tobacco Use  . Smoking status: Never Smoker  . Smokeless tobacco: Never Used  Substance Use Topics  . Alcohol use: No   FAMILY HX: No family history on file.  ALLERGIES:  Allergies  Allergen Reactions  . Haldol [Haloperidol Lactate] Hives     PERTINENT MEDICATIONS:  Outpatient Encounter Medications as of 11/21/2019  Medication Sig  . levothyroxine (SYNTHROID, LEVOTHROID) 100 MCG tablet Take 100 mcg by mouth daily before breakfast.  . metoprolol (LOPRESSOR) 50 MG tablet Take 50 mg by mouth 2 (two) times daily.  Marland Kitchen POLYETHYLENE GLYCOL 3350 PO Take 17 g by mouth at bedtime. Mix 1 capful into 8 oz of water  . saccharomyces boulardii (FLORASTOR) 250 MG capsule Take 1 capsule (250 mg total) by mouth 2 (two) times daily.  Marland Kitchen senna-docusate (SENOKOT-S) 8.6-50 MG tablet Take 1 tablet by mouth daily.  . [DISCONTINUED] hydrocortisone 2.5 % cream Apply 1 application topically.  . [DISCONTINUED] acetaminophen (TYLENOL) 325 MG tablet Take 650 mg by mouth every 4 (four) hours as needed for mild pain or fever.    No facility-administered encounter medications on file as of 11/21/2019.     Jason Coop, NP , DNP, MPH, AGPCNP-BC, [facility]  COVID-19 PATIENT SCREENING TOOL  Person answering questions: ____________staff______ _____   1.  Is the patient or any family member in the home showing any signs or symptoms regarding respiratory infection?               Person with Symptom- __________NA_________________  a. Fever                                                                          Yes___  No___          ___________________  b. Shortness of breath                                                    Yes___ No___          ___________________ c. Cough/congestion                                       Yes___  No___         ___________________ d.  Body aches/pains                                                         Yes___ No___        ____________________ e. Gastrointestinal symptoms (diarrhea, nausea)           Yes___ No___        ____________________  2. Within the past 14 days, has anyone living in the home had any contact with someone with or under investigation for COVID-19?    Yes___ No_X_   Person __________________

## 2020-02-27 ENCOUNTER — Non-Acute Institutional Stay: Payer: Medicare Other | Admitting: Primary Care

## 2020-02-27 ENCOUNTER — Other Ambulatory Visit: Payer: Self-pay

## 2020-02-27 DIAGNOSIS — F0280 Dementia in other diseases classified elsewhere without behavioral disturbance: Secondary | ICD-10-CM

## 2020-02-27 DIAGNOSIS — R1310 Dysphagia, unspecified: Secondary | ICD-10-CM

## 2020-02-27 DIAGNOSIS — Z515 Encounter for palliative care: Secondary | ICD-10-CM

## 2020-02-27 NOTE — Progress Notes (Signed)
[facility] Consult Note Telephone: [phone]  Fax: [phone]     Date of encounter: 02/27/20 PATIENT NAME: Angie Larsen [address] [place] [address] [phone] (home)  DOB: 04/10/1933 MRN: [id number]  PRIMARY CARE PROVIDER:    Gennie Alma, MD,  [place] [address] [place]:   Gennie Alma, [place],  [place] [address] [phone]  RESPONSIBLE PARTY:   Extended Emergency Contact Information Primary Emergency Contact: [facility] Address: [address]          [place], [place] [address] Johnnette Litter of [place] Phone: [phone] Relation: Legal Guardian Secondary Emergency Contact: Vernie Murders Address: [address]          [place],  [address] Johnnette Litter of [place] Phone: [phone] Relation: Brother  I met face to face with patient  In facility. Palliative Care was asked to follow this patient by consultation request of Gennie Alma, MD to help address advance care planning and goals of care. This is a follow up  visit.   ASSESSMENT AND RECOMMENDATIONS:   1. Advance Care Planning/Goals of Care: Goals include to maximize quality of life and symptom management. DNR on file. Sister is POA.  2. Symptom Management:   I met with Miss Rennaker in her nursing home room. She was in bed alert and interactive but has dysphasia. Staff states that she has been bedbound  and not up and about in her wheelchair for the last 4 to 6 weeks. While her mobility has decreased, her alertness is at baseline. Shes able to feed herself partial meals  with set up and cueing. She has spills so may need more feeding assistance. She denies complaints or pain and appears to be comfortable.   3. Follow up Palliative Care Visit: Palliative care will continue to follow for goals of care clarification and symptom management. Return 8 weeks or prn.  4. Family  /Caregiver/Community Supports: Sister is POA, lives in [place].  5. Cognitive / Functional decline: A and O x 1, dependent in all adls, iadls.  I spent 15 minutes providing this consultation,  from 1115 to 1130. More than 50% of the time in this consultation was spent coordinating communication.   CODE STATUS: DNR  PPS: 30%  HOSPICE ELIGIBILITY/DIAGNOSIS: TBD  Subjective:  CHIEF COMPLAINT: Debility  HISTORY OF PRESENT ILLNESS:  Angie Larsen is a 87 y.o. year old female  with dementia and debility. She has had 6% weight loss over past year in the context of progressive dementia.  We are asked to consult around advance care planning and complex medical decision making.    Review and summarization of old Epic records shows or history from other than patient.  History obtained from review of EMR, discussion with primary team, and  interview with family, caregiver  and/or Angie Larsen. Records reviewed and summarized above.   CURRENT PROBLEM LIST:  Patient Active Problem List   Diagnosis Date Noted   Alzheimer's dementia ([place] [place]) 11/21/2019   Depression 11/21/2019   Hypertension 11/21/2019   GERD (gastroesophageal reflux disease) 11/21/2019   Palliative care encounter 03/15/2018   Dysphagia 03/15/2018   Memory deficits 03/15/2018   Sepsis ([facility]) 02/17/2016   PAST MEDICAL HISTORY:  Active Ambulatory Problems    Diagnosis Date Noted   Sepsis ([place]) 02/17/2016   Palliative care encounter 03/15/2018   Dysphagia 03/15/2018   Memory deficits 03/15/2018   Alzheimer's dementia ([facility]) 11/21/2019  Depression 11/21/2019   Hypertension 11/21/2019   GERD (gastroesophageal reflux disease) 11/21/2019   Resolved Ambulatory Problems    Diagnosis Date Noted   No Resolved Ambulatory Problems   Past Medical History:  Diagnosis Date   Anxiety    SOCIAL HX:  Social History   Tobacco Use   Smoking status: Never Smoker   Smokeless tobacco: Never Used  Substance Use Topics    Alcohol use: No   FAMILY HX: No family history on file.    ALLERGIES:  Allergies  Allergen Reactions   Haldol [Haloperidol Lactate] Hives     PERTINENT MEDICATIONS:  Outpatient Encounter Medications as of 02/27/2020  Medication Sig   ammonium lactate (AMLACTIN) 12 % cream Apply 1 g topically as needed for dry skin.   levothyroxine (SYNTHROID, LEVOTHROID) 100 MCG tablet Take 100 mcg by mouth daily before breakfast.   metoprolol (LOPRESSOR) 50 MG tablet Take 50 mg by mouth 2 (two) times daily.   POLYETHYLENE GLYCOL 3350 PO Take 17 g by mouth at bedtime. Mix 1 capful into 8 oz of water   saccharomyces boulardii (FLORASTOR) 250 MG capsule Take 1 capsule (250 mg total) by mouth 2 (two) times daily.   senna-docusate (SENOKOT-S) 8.6-50 MG tablet Take 1 tablet by mouth daily.   No facility-administered encounter medications on file as of 02/27/2020.    Objective: ROS/staff  General: NAD ENMT: denies dysphagia Pulmonary: denies  cough, denies increased SOB Abdomen: endorses good appetite, denies constipation, endorses incontinence of bowel GU: denies dysuria, endorses incontinence of urine MSK:  endorses ROM limitations, no falls reported Skin: denies rashes or wounds Neurological: endorses weakness, denies pain, denies insomnia Psych: Endorses positive mood Heme/lymph/immuno: denies bruises, abnormal bleeding  Physical Exam: Current and past weights:123.6 Jan, 22; Dec 126 lbs,  131 lbs 1 year ago, 8 lb loss.  6% Constitutional: NAD General: frail appearing, thin EYES: anicteric sclera,lids intact, no discharge  ENMT: intact hearing,oral mucous membranes moist CV: S1S2, RRR, no LE edema Pulmonary: LCTA, no increased work of breathing, no cough, no audible wheezes, room air Abdomen: intake 50-75%,no ascites GU: deferred MSK: mod sarcopenia, decreased ROM in all extremities, no contractures of LE, non ambulatory Skin: warm and dry, no rashes or wounds on visible  skin Neuro: Generalized weakness, severe cognitive impairment Psych: non-anxious affect, A and O x 1 Hem/lymph/immuno: no widespread bruising   Thank you for the opportunity to participate in the care of Angie Larsen.  The palliative care team will continue to follow. Please call our office at [phone] if we can be of additional assistance.  Jason Coop, NP , DNP, MPH, AGPCNP-BC, ACHPN  COVID-19 PATIENT SCREENING TOOL  Person answering questions: ____________staff______ _____   1.  Is the patient or any family member in the home showing any signs or symptoms regarding respiratory infection?               Person with Symptom- __________NA_________________  a. Fever                                                                          Yes___ No___          ___________________  b. Shortness of breath  Yes___ No___          ___________________ °c. Cough/congestion                                       Yes___  No___         ___________________ °d. Body aches/pains                                                         Yes___ No___        ____________________ °e. Gastrointestinal symptoms (diarrhea, nausea)           Yes___ No___        ____________________ ° °2. Within the past 14 days, has anyone living in the home had any contact with someone with or under investigation for COVID-19?    °Yes___ No_X_   Person __________________ ° ° °

## 2020-04-16 ENCOUNTER — Non-Acute Institutional Stay: Payer: Medicare Other | Admitting: Primary Care

## 2020-04-16 ENCOUNTER — Other Ambulatory Visit: Payer: Self-pay

## 2020-04-16 DIAGNOSIS — R1310 Dysphagia, unspecified: Secondary | ICD-10-CM

## 2020-04-16 DIAGNOSIS — F0280 Dementia in other diseases classified elsewhere without behavioral disturbance: Secondary | ICD-10-CM

## 2020-04-16 DIAGNOSIS — Z515 Encounter for palliative care: Secondary | ICD-10-CM

## 2020-04-16 DIAGNOSIS — G308 Other Alzheimer's disease: Secondary | ICD-10-CM

## 2020-04-16 NOTE — Progress Notes (Signed)
[facility] Consult Note Telephone: [phone]  Fax: [phone]    Date of encounter: 04/16/20 PATIENT NAME: Angie Larsen [address] [place] [address] [phone] (home)  DOB: Dec 10, 1933 MRN: [id number]  PRIMARY CARE PROVIDER:    Gennie Alma, MD,  [place] [address] [place]:   Angie Larsen, [place],  [place] [address] [phone]  RESPONSIBLE PARTY:   Extended Emergency Contact Information Primary Emergency Contact: [facility] Address: [address]          [place], [place] [address] Angie Larsen of [place] Phone: [phone] Relation: Legal Guardian Secondary Emergency Contact: Angie Larsen Address: [address]          [place],  [address] Angie Larsen of [place] Phone: [phone] Relation: Brother  I met face to face with patient  In facility. Palliative Care was asked to follow this patient by consultation request of Angie Alma, MD to help address advance care planning and goals of care. This is a follow up  visit.   ASSESSMENT AND RECOMMENDATIONS:   1. Advance Care Planning/Goals of Care: Goals include to maximize quality of life and symptom management. T/c to [place], [place]. Reported pt is at her baseline. MOSt on file with DNR, comfort measures.  2. Symptom Management:   Nutrition: Has had slow but steady weight loss. It is now 119 lbs, down 4 lbs from 1/22. Has been self feeding snacks. Please continue to monitor intake and feed if needed.  Rhinitis: Family states her nose was running so badly yesterday she could not eat.  I did not observe this today.  Recommend anti histamine thru allergy season.  3. Follow up Palliative Care Visit: Palliative care will continue to follow for goals of care clarification and symptom management. Return 6-8 weeks or prn.  4. Family /Caregiver/Community Supports: Sister is POA. Lives in [place].   5.  Cognitive / Functional decline: a and o x 1. Dependent in all adls and iadls.  I spent 25 minutes providing this consultation,  from 1200 to 1225. More than 50% of the time in this consultation was spent in counseling and care coordination.  CODE STATUS: DNR  PPS: 30%  HOSPICE ELIGIBILITY/DIAGNOSIS: TBD  Subjective:  CHIEF COMPLAINT: dementia  HISTORY OF PRESENT ILLNESS:  Angie Larsen is a 87 y.o. year old female  with dementia with expressive dysphasia, weight loss. Family endorses pt knowing them but not able to converse. Pt is oob some but prefers bed.   We are asked to consult around advance care planning and complex medical decision making.    Review and summarization of old Epic records shows or history from other than patient.  Review of case with family member. Angie Larsen, [place]  History obtained from review of EMR, discussion with primary team, and  interview with family, caregiver  and/or Angie Larsen. Records reviewed and summarized above.   CURRENT PROBLEM LIST:  Patient Active Problem List   Diagnosis Date Noted  . Alzheimer's dementia ([facility]) 11/21/2019  . Depression 11/21/2019  . Hypertension 11/21/2019  . GERD (gastroesophageal reflux disease) 11/21/2019  . Palliative care encounter 03/15/2018  . Dysphagia 03/15/2018  . Memory deficits 03/15/2018  . Sepsis ([facility]) 02/17/2016   PAST MEDICAL HISTORY:  Active Ambulatory Problems    Diagnosis Date Noted  . Sepsis ([facility]) 02/17/2016  . Palliative care encounter 03/15/2018  . Dysphagia 03/15/2018  . Memory deficits 03/15/2018  . Alzheimer's dementia (  [facility]) 11/21/2019  . Depression 11/21/2019  . Hypertension 11/21/2019  . GERD (gastroesophageal reflux disease) 11/21/2019   Resolved Ambulatory Problems    Diagnosis Date Noted  . No Resolved Ambulatory Problems   Past Medical History:  Diagnosis Date  . Anxiety    SOCIAL HX:  Social History   Tobacco Use  . Smoking status: Never Smoker  . Smokeless tobacco: Never  Used  Substance Use Topics  . Alcohol use: No   FAMILY HX: No family history on file.    ALLERGIES:  Allergies  Allergen Reactions  . Haldol [Haloperidol Lactate] Hives     PERTINENT MEDICATIONS:  Outpatient Encounter Medications as of 04/16/2020  Medication Sig  . ammonium lactate (AMLACTIN) 12 % cream Apply 1 g topically as needed for dry skin.  Marland Kitchen levothyroxine (SYNTHROID, LEVOTHROID) 100 MCG tablet Take 100 mcg by mouth daily before breakfast.  . metoprolol (LOPRESSOR) 50 MG tablet Take 50 mg by mouth 2 (two) times daily.  Marland Kitchen POLYETHYLENE GLYCOL 3350 PO Take 17 g by mouth at bedtime. Mix 1 capful into 8 oz of water  . saccharomyces boulardii (FLORASTOR) 250 MG capsule Take 1 capsule (250 mg total) by mouth 2 (two) times daily.  Marland Kitchen senna-docusate (SENOKOT-S) 8.6-50 MG tablet Take 1 tablet by mouth daily.   No facility-administered encounter medications on file as of 04/16/2020.    Objective: ROS/staff General: NAD ENMT: denies overt dysphagia Cardiovascular: denies chest pain Pulmonary: denies  cough, denies increased SOB Abdomen: endorses fair appetite, denies constipation, endorses incontinence of bowel GU: denies dysuria, endorses incontinence of urine MSK:  endorses ROM limitations, no falls reported Skin: denies rashes or wounds Neurological: endorses weakness, denies pain, denies insomnia Psych: Endorses positive mood Heme/lymph/immuno: denies bruises, abnormal bleeding  Physical Exam: Current and past weights: 119 lbs, 3/22; 123 in 122. Constitutional: NAD General: frail appearing, thin EYES: anicteric sclera, lids intact, no discharge  ENMT: intact hearing,oral mucous membranes moist CV: RRR, no LE edema Pulmonary:  no increased work of breathing, no cough, no audible wheezes, room air Abdomen: intake 25-50%,no ascites GU: deferred MSK: severe sarcopenia, decreased ROM in all extremities, no contractures of LE, non ambulatory Skin: warm and dry, no rashes or  wounds on visible skin Neuro: Generalized weakness, ++cognitive impairment Psych: non-anxious affect, A and O x 1 Hem/lymph/immuno: no widespread bruising   Thank you for the opportunity to participate in the care of Ms. Vermeer.  The palliative care team will continue to follow. Please call our office at [phone] if we can be of additional assistance.  Jason Coop, NP , DNP, MPH, AGPCNP-BC, ACHPN   COVID-19 PATIENT SCREENING TOOL  Person answering questions: _______staff____________   1.  Is the patient or any family member in the home showing any signs or symptoms regarding respiratory infection?                  Person with Symptom  ______________na___________ a. Fever/chills/headache                                                        Yes___ No__X_            b. Shortness of breath  Yes___ No__X_           c. Cough/congestion                                               Yes___  No__X_          d. Muscle/Body aches/pains                                                   Yes___ No__X_         e. Gastrointestinal symptoms (diarrhea,nausea)             Yes___ No__X_         f. Sudden loss of smell or taste      Yes___ No__X_        2. Within the past 10 days, has anyone living in the home had any contact with someone with or under investigation for COVID-19?    Yes___ No__X__   Person __________________

## 2020-06-02 ENCOUNTER — Non-Acute Institutional Stay: Payer: Medicare Other | Admitting: Primary Care

## 2020-06-02 ENCOUNTER — Other Ambulatory Visit: Payer: Self-pay

## 2020-06-02 DIAGNOSIS — G308 Other Alzheimer's disease: Secondary | ICD-10-CM

## 2020-06-02 DIAGNOSIS — Z515 Encounter for palliative care: Secondary | ICD-10-CM

## 2020-06-02 DIAGNOSIS — F0280 Dementia in other diseases classified elsewhere without behavioral disturbance: Secondary | ICD-10-CM

## 2020-06-02 NOTE — Progress Notes (Signed)
[facility] Consult Note Telephone: [phone]  Fax: [phone]    Date of encounter: 06/02/20 PATIENT NAME: Angie Larsen [address] [place] [address]   [phone] (home)  DOB: 1933/06/09 MRN: [id number] PRIMARY CARE PROVIDER:    Brayton Mars, [place],  [place] [address] [phone]   REFERRING PROVIDER:   Brayton Mars, [place],  [place] [address] [phone]  RESPONSIBLE PARTY:    Contact Information    Name Relation Home Work Mobile   [facility] Legal Guardian [phone]     Angie Larsen [phone]        I met face to face with patient in facility. Palliative Care was asked to follow this patient by consultation request of  Brayton Mars, MD to address advance care planning and complex medical decision making. This is a follow up visit.                                   ASSESSMENT AND PLAN / RECOMMENDATIONS:   Advance Care Planning/Goals of Care: Goals include to maximize quality of life and symptom management.   CODE STATUS: DNR  Symptom Management/Plan:  Staff endorses patient is oob in chair and eating well. She can feed herself finger foods. Today she is able to converse with me and brightens at mention of her family. Staff report no falls, no wounds. Patient is stable at her baseline of dementia with debility.  Follow up Palliative Care Visit: Palliative care will continue to follow for complex medical decision making, advance care planning, and clarification of goals. Return 8-12 weeks or prn.  I spent 15 minutes providing this consultation. More than 50% of the time in this consultation was spent in counseling and care coordination.  PPS: 30%  HOSPICE ELIGIBILITY/DIAGNOSIS: TBD  Chief Complaint: dementia and debility  HISTORY OF PRESENT ILLNESS:  Angie Larsen is a 87 y.o. year old female  with debility due to dementia,  .   History obtained  from review of EMR, discussion with primary team, and interview with family, facility staff/caregiver and/or Angie Larsen. Medications reviewed with staff.   Outpatient Encounter Medications as of 06/02/2020  Medication Sig  . levothyroxine (SYNTHROID, LEVOTHROID) 100 MCG tablet Take 100 mcg by mouth daily before breakfast.  . metoprolol (LOPRESSOR) 50 MG tablet Take 50 mg by mouth 2 (two) times daily.  Marland Kitchen POLYETHYLENE GLYCOL 3350 PO Take 17 g by mouth at bedtime. Mix 1 capful into 8 oz of water  . saccharomyces boulardii (FLORASTOR) 250 MG capsule Take 1 capsule (250 mg total) by mouth 2 (two) times daily.  Marland Kitchen senna-docusate (SENOKOT-S) 8.6-50 MG tablet Take 1 tablet by mouth daily.   No facility-administered encounter medications on file as of 06/02/2020.     ROS  General: NAD ENMT: denies dysphagia Pulmonary: denies cough, denies increased SOB Abdomen: endorses good appetite, denies constipation, endorses incontinence of bowel GU: denies dysuria, endorses incontinence of urine MSK:  endorses weakness,  no falls reported Skin: denies rashes or wounds Neurological: denies pain, denies insomnia Psych: Endorses positive mood Heme/lymph/immuno: denies bruises, abnormal bleeding  Physical Exam: Current and past weights: 122.6 lbs  Constitutional: NAD General: frail appearing, thin EYES: anicteric sclera, lids intact, no discharge  ENMT: intact hearing, oral mucous membranes moist CV: S1S2, RRR, slight bil  LE edema Pulmonary:  no increased work of breathing, no cough, room  air Abdomen: intake 75%,no ascites MSK: severe sarcopenia, moves all extremities, non ambulatory Skin: warm and dry, no rashes or wounds on visible skin Neuro:  + generalized weakness,  severe cognitive impairment Psych: non-anxious affect, A and O x 1 Hem/lymph/immuno: no widespread bruising  Thank you for the opportunity to participate in the care of Angie Larsen.  The palliative care team will continue to  follow. Please call our office at [phone] if we can be of additional assistance.   Jason Coop, NP , DNP, MPH, AGPCNP-BC, ACHPN  COVID-19 PATIENT SCREENING TOOL Asked and negative response unless otherwise noted:   Have you had symptoms of covid, tested positive or been in contact with someone with symptoms/positive test in the past 5-10 days?

## 2020-10-15 ENCOUNTER — Other Ambulatory Visit: Payer: Self-pay

## 2020-10-15 ENCOUNTER — Non-Acute Institutional Stay: Payer: Medicare Other | Admitting: Primary Care

## 2020-10-15 DIAGNOSIS — Z515 Encounter for palliative care: Secondary | ICD-10-CM

## 2020-10-15 DIAGNOSIS — G308 Other Alzheimer's disease: Secondary | ICD-10-CM

## 2020-10-15 DIAGNOSIS — F0280 Dementia in other diseases classified elsewhere without behavioral disturbance: Secondary | ICD-10-CM

## 2020-10-15 NOTE — Progress Notes (Signed)
  [facility] Consult Note Telephone: [phone]  Fax: [phone]    Date of encounter: 10/15/20 1:21 PM PATIENT NAME: Angie Larsen [address] [place] [address]   [phone] (home)  DOB: 11/27/1933 MRN: [id number] PRIMARY CARE PROVIDER:    Smetka, Vlastimil, MD,  [address] [place] [address] [phone]  REFERRING PROVIDER:   Smetka, Vlastimil, MD [address] [place],  [place] [address] [phone]  RESPONSIBLE PARTY:    Contact Information     Name Relation Home Work Mobile   Larsen,Angie Legal Guardian [phone]     Larsen,Angie Brother [phone]         I met face to face with patient  in [facility] facility. Palliative Care was asked to follow this patient by consultation request of  Smetka, Vlastimil, MD to address advance care planning and complex medical decision making. This is a follow up visit.                                   ASSESSMENT AND PLAN / RECOMMENDATIONS:   Advance Care Planning/Goals of Care: Goals include to maximize quality of life and symptom management. DNR  Symptom Management/Plan:  Met with patient today. Was in w/c and able to propel in the hall. Staff reports increased agitation over the past months, with new  Rx for depakote. This seems to have helped with her agitation. Today she is attempting some conversation but not able, FAST score 7 C. Historically she has been soothed by a doll but one on in her purview at this time.    Follow up Palliative Care Visit: Palliative care will continue to follow for complex medical decision making, advance care planning, and clarification of goals. Return 8 weeks or prn.  I spent 25 minutes providing this consultation. More than 50% of the time in this consultation was spent in counseling and care coordination.  PPS: 30%  HOSPICE ELIGIBILITY/DIAGNOSIS: TBD  Chief Complaint: dementia  HISTORY OF PRESENT ILLNESS:  Angie Larsen is a 87 y.o.  year old female  with dementia with h/o some agitation .   History obtained from review of EMR, discussion with primary team, and interview with family, facility staff/caregiver and/or [name].  I reviewed available labs, medications, imaging, studies and related documents from the EMR.  Records reviewed and summarized above.   ROS/ staff  General: NAD ENMT: occ dysphagia Pulmonary: denies cough, denies increased SOB Abdomen: endorses good appetite, denies constipation, endorses incontinence of bowel GU: denies dysuria, endorses incontinence of urine MSK:  denies weakness,  no falls reported Skin: denies rashes or wounds Neurological: denies pain, denies insomnia Psych: Endorses agitated mood Heme/lymph/immuno: denies bruises, abnormal bleeding  Physical Exam: Current and past weights: 122.6 lbs, 129.6 lbs in  6/21, 131.8 Lbs in Jan 2021 Constitutional: NAD General: frail appearing, thin EYES: anicteric sclera, lids intact, no discharge  ENMT: intact hearing, oral mucous membranes moist, dentition intact CV: RRR, no LE edema Pulmonary:  no increased work of breathing, no cough, room air Abdomen: intake 75%, no ascites GU: deferred MSK: ++ sarcopenia, moves all extremities,  non ambulatory Skin: warm and dry, no rashes or wounds on visible skin Neuro:  +generalized weakness,  + cognitive impairment Psych: anxious affect, A and O x 1 Hem/lymph/immuno: no widespread bruising  Outpatient Encounter Medications as of 10/15/2020  Medication Sig   divalproex (DEPAKOTE SPRINKLE) 125 MG   capsule Take by mouth 2 (two) times daily.   levothyroxine (SYNTHROID, LEVOTHROID) 100 MCG tablet Take 100 mcg by mouth daily before breakfast.   metoprolol (LOPRESSOR) 50 MG tablet Take 50 mg by mouth 2 (two) times daily.   POLYETHYLENE GLYCOL 3350 PO Take 17 g by mouth at bedtime. Mix 1 capful into 8 oz of water   saccharomyces boulardii (FLORASTOR) 250 MG capsule Take 1 capsule (250 mg total) by  mouth 2 (two) times daily.   senna-docusate (SENOKOT-S) 8.6-50 MG tablet Take 1 tablet by mouth daily.   No facility-administered encounter medications on file as of 10/15/2020.    Thank you for the opportunity to participate in the care of [name].  The palliative care team will continue to follow. Please call our office at [phone] if we can be of additional assistance.    [place] , NP   COVID-19 PATIENT SCREENING TOOL Asked and negative response unless otherwise noted:   Have you had symptoms of covid, tested positive or been in contact with someone with symptoms/positive test in the past 5-10 days?   

## 2021-01-07 ENCOUNTER — Other Ambulatory Visit: Payer: Self-pay

## 2021-01-07 ENCOUNTER — Non-Acute Institutional Stay: Payer: Medicare Other | Admitting: Primary Care

## 2021-01-10 ENCOUNTER — Other Ambulatory Visit: Payer: Self-pay

## 2021-01-10 ENCOUNTER — Non-Acute Institutional Stay: Payer: Medicare Other | Admitting: Primary Care

## 2021-01-10 DIAGNOSIS — R1310 Dysphagia, unspecified: Secondary | ICD-10-CM

## 2021-01-10 DIAGNOSIS — R413 Other amnesia: Secondary | ICD-10-CM

## 2021-01-10 DIAGNOSIS — Z515 Encounter for palliative care: Secondary | ICD-10-CM

## 2021-01-10 NOTE — Progress Notes (Signed)
[facility] Palliative Care Consult Note Telephone: [phone]  Fax: [phone]    Date of encounter: 01/10/21 6:04 PM PATIENT NAME: Angie Larsen [address] [place] [address]   [phone] (home)  DOB: November 16, 1933 MRN: [id number] PRIMARY CARE PROVIDER:    Brayton Mars, MD,  [address] [place] [address] [phone]  [place]:   Brayton Mars, [place],   [address] [phone]  RESPONSIBLE PARTY:    Contact Information     Name Relation Home Work Mobile   [facility] Legal Guardian [phone]     Renaye Rakers [phone]          I met face to face with patient in [facility] facility. Palliative Care was asked to follow this patient by consultation request of  Brayton Mars, MD to address advance care planning and complex medical decision making. This is a follow up visit.                                   ASSESSMENT AND PLAN / RECOMMENDATIONS:   Advance Care Planning/Goals of Care: Goals include to maximize quality of life and symptom management.  CODE STATUS: DNR  Symptom Management/Plan:  Staff endorse baseline, pt is oob in w/c and in halls. Endorses hunger. Able to propel self and has been eating at baseline. No falls reported. Appears comfortable, able to speak a few words, but does become agitated with effort.  Follow up Palliative Care Visit: Palliative care will continue to follow for complex medical decision making, advance care planning, and clarification of goals. Return 12-16 weeks or prn.  I spent 15 minutes providing this consultation. More than 50% of the time in this consultation was spent in counseling and care coordination.  PPS: 40%  HOSPICE ELIGIBILITY/DIAGNOSIS: TBD  Chief Complaint: debility  HISTORY OF PRESENT ILLNESS:  Angie Larsen is a 87 y.o. year old female  with Dementia, immobility, debility  History obtained from review of EMR, discussion  with primary team, and interview with family, facility staff/caregiver and/or Ms. Segall.  I reviewed available labs, medications, imaging, studies and related documents from the EMR.  Records reviewed and summarized above.   ROS   General: NAD ENMT: denies dysphagia Pulmonary: denies cough, denies increased SOB Abdomen: endorses good appetite, denies constipation, endorses incontinence of bowel GU: denies dysuria, endorses incontinence of urine MSK:  endorses weakness,  no falls reported Skin: denies rashes or wounds Neurological: denies pain, denies insomnia Psych: Endorses positive mood Heme/lymph/immuno: denies bruises, abnormal bleeding  Physical Exam: Current and past weights:121.60 lbs, stable  Constitutional: NAD General: frail appearing, thin EYES: anicteric sclera, lids intact, no discharge  ENMT: intact hearing, oral mucous membranes moist, dentition intact CV: S1S2, RRR, 1-2+ LE edema Pulmonary: LCTA, no increased work of breathing, no cough, room air Abdomen: intake 75%, no ascites GU: deferred MSK: + sarcopenia, moves all extremities, nonambulatory Skin: warm and dry, no rashes or wounds on visible skin Neuro:  +generalized weakness,  advanced  cognitive impairment Psych: non-anxious affect, A and O x 1 Hem/lymph/immuno: no widespread bruising   Thank you for the opportunity to participate in the care of Ms. Heyliger.  The palliative care team will continue to follow. Please call our office at [phone] if we can be of additional assistance.   Jason Coop, NP DNP, AGPCNP-BC  COVID-19 PATIENT SCREENING TOOL Asked and negative response unless  otherwise noted:   Have you had symptoms of covid, tested positive or been in contact with someone with symptoms/positive test in the past 5-10 days?

## 2021-04-15 ENCOUNTER — Other Ambulatory Visit: Payer: Self-pay

## 2021-04-15 ENCOUNTER — Non-Acute Institutional Stay: Payer: Medicare Other | Admitting: Primary Care

## 2021-04-15 VITALS — Ht 64.0 in | Wt 118.0 lb

## 2021-04-15 DIAGNOSIS — G308 Other Alzheimer's disease: Secondary | ICD-10-CM

## 2021-04-15 DIAGNOSIS — F02C18 Dementia in other diseases classified elsewhere, severe, with other behavioral disturbance: Secondary | ICD-10-CM

## 2021-04-15 DIAGNOSIS — Z515 Encounter for palliative care: Secondary | ICD-10-CM

## 2021-04-15 NOTE — Progress Notes (Signed)
? ? ?Manufacturing engineer ?Community Palliative Care Consult Note ?Telephone: [phone]  ?Fax: [phone]  ? ? ?Date of encounter: 04/15/21 ?11:45 AM ?PATIENT NAME: Angie Larsen ?[place] [address]   ?[phone] (home)  ?DOB: 12-May-1933 ?MRN: [id number] ?PRIMARY CARE PROVIDER:    ?Angie Mars, MD,  ?[place] ?[place] [address] ?[phone] ? ?REFERRING PROVIDER:   ?Angie Mars, MD ?[place] ?[place],  [place] [address] ?[phone] ? ?RESPONSIBLE PARTY:    ?Contact Information   ? ? Name Relation Home Work Mobile  ? Angie Larsen [phone]    ? Angie Larsen Brother [phone]    ? ?  ? ? ? ?I met face to face with patient  in [facility] facility. Palliative Care was asked to follow this patient by consultation request of  Angie Mars, MD to address advance care planning and complex medical decision making. This is a follow up visit. ? ?                                 ASSESSMENT AND PLAN / RECOMMENDATIONS:  ? ?Advance Care Planning/Goals of Care: Goals include to maximize quality of life and symptom management.  ?CODE STATUS DNR ? ?Symptom Management/Plan: ? ?Patient oob in w/c, awaiting lunch. Is able to smile and attempt to interact verbally. Staff do not endorse new deficits.  ? ?Nutrition: 5 pound loss of weight in 1 years ( from 123 lbs to 118 lbs, 4%).No skin breakdown noted or reported to me. ? ?Mobility: In w/c as ambulation. No falls reported.Currently taking PT to address abnormal posture. ? ?Mood: Begun on sertraline 25 mg in 1/23. Appears to have a brighter affect than other visits. ? ?Follow up Palliative Care Visit: Palliative care will continue to follow for complex medical decision making, advance care planning, and clarification of goals. Return 6-8 weeks or prn. ? ?This visit was coded based on medical decision making (MDM). ? ?PPS: 30% ? ?HOSPICE ELIGIBILITY/DIAGNOSIS: TBD ? ?Chief Complaint: debility ? ?HISTORY OF  PRESENT ILLNESS:  Angie Larsen is a 88 y.o. year old female  with debility, dementia, immobility . Patient seen today to review palliative care needs to include medical decision making and advance care planning as appropriate.  ? ?History obtained from review of EMR, discussion with primary team, and interview with family, facility staff/caregiver and/or Angie Larsen.  ?I reviewed available labs, medications, imaging, studies and related documents from the EMR.  Records reviewed and summarized above.  ? ?ROS ? ?General: NAD ?ENMT: denies dysphagia ?Pulmonary: denies cough, denies increased SOB ?Abdomen: endorses good appetite, denies constipation, endorses incontinence of bowel ?GU: denies dysuria, endorses incontinence of urine ?MSK:  denies  increased weakness,  no falls reported ?Skin: denies rashes or wounds ?Neurological: denies pain, denies insomnia ?Psych: Endorses positive mood ? ?Physical Exam: ?Current and past weights: 118 lbs Body mass index is 20.25 kg/m?Marland Kitchen ?Constitutional: NAD ?General: frail appearing, thin ?EYES: anicteric sclera, lids intact, no discharge  ?ENMT:  hard of  hearing, oral mucous membranes moist ?CV: S1S2, RRR, no LE edema ?Pulmonary: LCTA, no increased work of breathing, no cough, room air ?Abdomen: intake 75%, normo-active BS + 4 quadrants, soft and non tender, no ascites ?MSK: +sarcopenia, moves all extremities, non ambulatory,in w/c ?Skin: warm and dry, no rashes or wounds on visible skin ?Neuro:  + generalized weakness,  severe  cognitive impairment, non-anxious affect ? ? ?Thank  you for the opportunity to participate in the care of Angie Larsen.  The palliative care team will continue to follow. Please call our office at [phone] if we can be of additional assistance.  ? ?Angie Coop, NP DNP, AGPCNP-BC ? ?COVID-19 PATIENT SCREENING TOOL ?Asked and negative response unless otherwise noted:  ? ?Have you had symptoms of covid, tested positive or been in contact with  someone with symptoms/positive test in the past 5-10 days?  ? ?

## 2021-08-05 ENCOUNTER — Non-Acute Institutional Stay: Payer: Medicare Other | Admitting: Primary Care

## 2021-08-05 ENCOUNTER — Encounter: Payer: Self-pay | Admitting: Primary Care

## 2021-08-05 DIAGNOSIS — G308 Other Alzheimer's disease: Secondary | ICD-10-CM

## 2021-08-05 DIAGNOSIS — F32A Depression, unspecified: Secondary | ICD-10-CM

## 2021-08-05 DIAGNOSIS — R413 Other amnesia: Secondary | ICD-10-CM

## 2021-08-05 DIAGNOSIS — F02C18 Dementia in other diseases classified elsewhere, severe, with other behavioral disturbance: Secondary | ICD-10-CM

## 2021-08-05 NOTE — Progress Notes (Signed)
[facility] Palliative Care Consult Note Telephone: [phone]  Fax: [phone]    Date of encounter: 08/05/21 11:36 AM PATIENT NAME: Angie Larsen [address] [place] [address]   [phone] (home)  DOB: Jun 29, 1933 MRN: [id number] PRIMARY CARE PROVIDER:    Brayton Mars, MD,  [address] [place] [address] [phone]  [place]:   Brayton Mars, [place],  [place] [address] [phone]  RESPONSIBLE PARTY:    Contact Information     Name Relation Home Work Mobile   [facility] Legal Guardian [phone]     Renaye Rakers [phone]        I met face to face with patient and family in [facility] facility. Palliative Care was asked to follow this patient by consultation request of  Brayton Mars, MD to address advance care planning and complex medical decision making. This is a follow up visit.                                   ASSESSMENT AND PLAN / RECOMMENDATIONS:   Advance Care Planning/Goals of Care: Goals include to maximize quality of life and symptom management. Patient/health care surrogate gave his/her permission to discuss.Our advance care planning conversation included a discussion about:     Identification of a healthcare agent - sister Angie Larsen with sister in SNF today, not POA. CODE STATUS: DNR  Symptom Management/Plan:  Patient visited in nursing home, visiting with her sister. Sister states she's doing well, enjoying a day in the beauty shop. Eating well, alert and interactive with sister.  Weights are stable and no new symptoms. Staff report she is at behavior baseline.  Follow up Palliative Care Visit: Palliative care will continue to follow for complex medical decision making, advance care planning, and clarification of goals. Return 8 weeks or prn.  I spent 15 minutes providing this consultation. More than 50% of the time in this consultation was spent in  counseling and care coordination.  PPS: 40%  HOSPICE ELIGIBILITY/DIAGNOSIS: TBD  Chief Complaint: dementia  HISTORY OF PRESENT ILLNESS:  Angie Larsen is a 88 y.o. year old female  with dementia,  . Patient seen today to review palliative care needs to include medical decision making and advance care planning as appropriate.   History obtained from review of EMR, discussion with primary team, and interview with family, facility staff/caregiver and/or Angie Larsen.  I reviewed available labs, medications, imaging, studies and related documents from the EMR.  Records reviewed and summarized above.   ROS   General: NAD ENMT: denies dysphagia Pulmonary: denies cough, denies increased SOB Abdomen: endorses good appetite, denies constipation, endorses incontinence of bowel GU: denies dysuria, endorses incontinence of urine MSK:  denies  increased weakness, no falls reported Skin: denies rashes or wounds Neurological: denies pain, denies insomnia Psych: Endorses positive mood  Physical Exam: Current and past weights: 116 lbs, stable / gaining Constitutional: NAD General: frail appearing,  WNWD EYES: anicteric sclera, lids intact, no discharge  ENMT: intact hearing, oral mucous membranes moist, dentition intact CV: no LE edema Pulmonary: no increased work of breathing, no cough, room air Abdomen: intake 75%, no ascites MSK: +sarcopenia, moves all extremities, ambulatory Skin: warm and dry, no rashes or wounds on visible skin Neuro:  + generalized weakness,  + cognitive impairment, slight anxious affect   Thank you for the opportunity to participate in the  care of Angie Larsen.  The palliative care team will continue to follow. Please call our office at [phone] if we can be of additional assistance.   Jason Coop, NP DNP, AGPCNP-BC  COVID-19 PATIENT SCREENING TOOL Asked and negative response unless otherwise noted:   Have you had symptoms of covid, tested positive  or been in contact with someone with symptoms/positive test in the past 5-10 days?

## 2021-10-20 ENCOUNTER — Non-Acute Institutional Stay: Payer: Medicare Other | Admitting: Primary Care

## 2021-10-20 DIAGNOSIS — F02C18 Dementia in other diseases classified elsewhere, severe, with other behavioral disturbance: Secondary | ICD-10-CM

## 2021-10-20 DIAGNOSIS — Z515 Encounter for palliative care: Secondary | ICD-10-CM

## 2021-10-20 DIAGNOSIS — G308 Other Alzheimer's disease: Secondary | ICD-10-CM

## 2021-10-20 NOTE — Progress Notes (Signed)
[facility] Palliative Care Consult Note Telephone: [phone]  Fax: [phone]    Date of encounter: 10/20/21 2:25 PM PATIENT NAME: Angie Larsen [address] [place] [address]   [phone] (home)  DOB: 12-24-1933 MRN: [id number] PRIMARY CARE PROVIDER:    Brayton Mars, MD,  [address] [place] [address] [phone]  [place]:   Brayton Mars, [place],  [place] [address] [phone]  RESPONSIBLE PARTY:    Contact Information     Name Relation Home Work Mobile   [facility] Legal Guardian [phone]     Renaye Rakers [phone]          I met face to face with patient in [facility] facility. Palliative Care was asked to follow this patient by consultation request of  Brayton Mars, MD to address advance care planning and complex medical decision making. This is a follow up visit.                                   ASSESSMENT AND PLAN / RECOMMENDATIONS:   Advance Care Planning/Goals of Care: Goals include to maximize quality of life and symptom management.  Identification of a healthcare agent - sister  CODE STATUS:DNR  Symptom Management/Plan:  UP in w/c asks for coke. Has had lunch. Able to regard me and attempt communication. Order on chart for hospice eval but nothing in record. I have reached out to [facility] NP to follow up RE referral. I would concur due to weight loss although her disease process dementia wise appears stable\ and last albumin WNL.   Follow up Palliative Care Visit: Palliative care will continue to follow for complex medical decision making, advance care planning, and clarification of goals. Return 8 weeks or prn.  This visit was coded based on medical decision making (MDM).  PPS: 30%  HOSPICE ELIGIBILITY/DIAGNOSIS: TBD  Chief Complaint: dementia  HISTORY OF PRESENT ILLNESS:  Angie Larsen is a 88 y.o. year old female  with dementia, low BMI . Patient seen  today to review palliative care needs to include medical decision making and advance care planning as appropriate.   History obtained from review of EMR, discussion with primary team, and interview with family, facility staff/caregiver and/or Angie Larsen.  I reviewed available labs, medications, imaging, studies and related documents from the EMR.  Records reviewed and summarized above.   ROS/staff General NAD, asks for coke ENMT: denies dysphagia Cardiovascular: denies chest pain, denies DOE Pulmonary: denies cough, denies increased SOB Abdomen: endorses good appetite, denies constipation, endorses incontinence of bowel GU: denies dysuria, endorses incontinence of urine MSK:  denies  increased weakness,  no falls reported, w/c bound Skin: denies rashes or wounds Neurological: denies pain, denies insomnia Psych: Endorses positive mood  Physical Exam: Current and past weights: 115 lbs, continues to lose weight but slowly. Albumin in July 3.5 g/dl. Also has TSH WNL. Constitutional: NAD General: frail appearing, thin EYES: anicteric sclera, lids intact, no discharge  ENMT: intact hearing, oral mucous membranes moist, edentulous CV:  RRR, no LE edema Pulmonary: no increased work of breathing, no cough, room air Abdomen: intake 100%, soft and non tender, no ascites MSK: + sarcopenia, moves all extremities,  non ambulatory Skin: warm and dry, no rashes or wounds on visible skin Neuro:  no  new generalized weakness,  severe  cognitive impairment, non-anxious affect, can answer yes/ no questions.  Thank you for the opportunity to participate in the care of Angie Larsen.  The palliative care team will continue to follow. Please call our office at [phone] if we can be of additional assistance.   Jason Coop, NP DNP, AGPCNP-BC  COVID-19 PATIENT SCREENING TOOL Asked and negative response unless otherwise noted:   Have you had symptoms of covid, tested positive or been in  contact with someone with symptoms/positive test in the past 5-10 days?

## 2021-12-05 ENCOUNTER — Non-Acute Institutional Stay: Payer: Medicare Other | Admitting: Primary Care

## 2021-12-05 DIAGNOSIS — Z515 Encounter for palliative care: Secondary | ICD-10-CM

## 2021-12-05 DIAGNOSIS — G308 Other Alzheimer's disease: Secondary | ICD-10-CM

## 2021-12-05 DIAGNOSIS — R413 Other amnesia: Secondary | ICD-10-CM

## 2021-12-05 DIAGNOSIS — R1310 Dysphagia, unspecified: Secondary | ICD-10-CM

## 2021-12-05 NOTE — Progress Notes (Signed)
[facility] Palliative Care Consult Note Telephone: [phone]  Fax: [phone]    Date of encounter: 12/05/21 11:39 AM PATIENT NAME: Angie Larsen [address] [place] [address]   [phone] (home)  DOB: 1933-06-25 MRN: [id number] PRIMARY CARE PROVIDER:    Brayton Mars, MD,  [address] [place] [address] [phone]  REFERRING PROVIDER:   Brayton Mars, [place],  [place] [address] [phone]  RESPONSIBLE PARTY:    Contact Information     Name Relation Home Work Mobile   [facility] Legal Guardian [phone]     Renaye Rakers [phone]         I met face to face with patient in [facility] facility. Palliative Care was asked to follow this patient by consultation request of  Brayton Mars, MD to address advance care planning and complex medical decision making. This is a follow up visit.                                   ASSESSMENT AND PLAN / RECOMMENDATIONS:   Advance Care Planning/Goals of Care: Goals include to maximize quality of life and symptom management. Identification of a healthcare agent - Sister in law CODE STATUS: DNR  Symptom Management/Plan:  Patient at her baseline, slight weight loss over time consistent with decline. Staff reports they must feed her most of the time. Today she is able to regard me, tries to speak. Cannot smile. She does not have her doll which soothes her.   Follow up Palliative Care Visit: Palliative care will continue to follow for complex medical decision making, advance care planning, and clarification of goals. Return 8 weeks or prn. Family requested hospice but does not appear to be eligible for hospice.  This visit was coded based on medical decision making (MDM).  PPS: 30%  HOSPICE ELIGIBILITY/DIAGNOSIS: TBD  Chief Complaint: dementia, debility  HISTORY OF PRESENT ILLNESS:  Angie Larsen is a 88 y.o. year old female  with dementia, limited  mobility, debility .   History obtained from review of EMR, discussion with primary team, and interview with family, facility staff/caregiver and/or Angie Larsen.  I reviewed available labs, medications, imaging, studies and related documents from the EMR.  Records reviewed and summarized above.   ROS/staff   General: NAD Pulmonary: denies cough, denies increased SOB Abdomen: endorses good appetite, must be fed, denies constipation, endorses incontinence of bowel GU: denies dysuria, endorses incontinence of urine MSK:  denies increased weakness, no falls reported Skin: denies rashes or wounds Neurological: denies pain on PAINAD, denies insomnia Psych: Endorses flat mood Heme/lymph/immuno: denies bruises, abnormal bleeding  Physical Exam: Current and past weights:115 lbs, 2% loss in 6 months Constitutional: NAD General: frail appearing, thin EYES: anicteric sclera, lids intact, no discharge  ENMT: intact hearing, oral mucous membranes moist CV:  no LE edema Pulmonary:  no increased work of breathing, no cough, room air Abdomen: intake 70%, soft and non -tender, no ascites GU: deferred MSK:  +sarcopenia, moves all extremities, non  -ambulatory Skin: warm and dry, no rashes or wounds on visible skin Neuro:  + generalized weakness,  severe cognitive impairment Psych: occ anxious affect, A and O x 1 Hem/lymph/immuno: no widespread bruising   Thank you for the opportunity to participate in the care of Angie Larsen.  The palliative care team will continue to follow. Please call our office at [phone] if we  can be of additional assistance.   Jason Coop, NP   COVID-19 PATIENT SCREENING TOOL Asked and negative response unless otherwise noted:   Have you had symptoms of covid, tested positive or been in contact with someone with symptoms/positive test in the past 5-10 days?

## 2022-02-08 ENCOUNTER — Non-Acute Institutional Stay: Payer: Medicare Other | Admitting: Nurse Practitioner

## 2022-02-08 ENCOUNTER — Encounter: Payer: Self-pay | Admitting: Nurse Practitioner

## 2022-02-08 DIAGNOSIS — Z515 Encounter for palliative care: Secondary | ICD-10-CM

## 2022-02-08 DIAGNOSIS — G308 Other Alzheimer's disease: Secondary | ICD-10-CM

## 2022-02-08 DIAGNOSIS — R1310 Dysphagia, unspecified: Secondary | ICD-10-CM

## 2022-02-08 DIAGNOSIS — R630 Anorexia: Secondary | ICD-10-CM

## 2022-02-08 NOTE — Progress Notes (Addendum)
[facility] Palliative Care Consult Note Telephone: [phone]  Fax: [phone]    Date of encounter: 02/08/22 6:25 PM PATIENT NAME: Angie Larsen [address] [place] [address]   [phone] (home)  DOB: 1933/10/17 MRN: [id number] PRIMARY CARE PROVIDER:    [place] [address] [place] [address] [phone]  RESPONSIBLE PARTY:    Contact Information     Name Relation Home Work Angie Larsen [phone]     Angie Larsen [phone]             [facility] Pall[facility] Consult Note Telephone: [phone]  Fax: [phone]      Date of encounter: 12/05/21 11:39 AM PATIENT NAME: Angie Larsen [address] [place] [address]   [phone] (home)  DOB: 09-Jun-1933 MRN: [id number] PRIMARY CARE PROVIDER:    [facility] [address] [place]  [address] [phone]   RESPONSIBLE PARTY:    Contact Information       Name Relation Home Work Mobile    Angie Larsen Legal [place]en [place] Brother [phone]                  I met face to face with patient in [facility] facility. Palliative Care was asked to follow this patient by consultation request of  Angie Mars, MD to address advance care planning and complex medical decision making. This is a follow up visit.  RECOMMENDATIONS and PLAN:  1. ACP: DNR 2. 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 Continue to monitor weights, appetite, aspiration precautions.  12/05/2021 weight 115 lbs 02/07/2022 weight 113.8 lbs 3. Alzheimer's Dementia. Continue with supportive measures as chronic disease remains Progressive. Minimize suffering, will revisit hospice when further declines Palliative care 1/8 / 2018 Palliative care 5 / 5 / 2014 to 5 / 7 / 2014 Palliative care 1 / 06/2016 to 2 / 1 / 2018 Hospice 05/26/2016 to 12 / 6 / 2018  4. Palliative care encounter; Palliative  medicine team will continue to support patient, patient's family, and medical team. Visit consisted of counseling and education dealing with the complex and emotionally intense issues of symptom management and palliative care in the setting of serious and potentially life-threatening illness  02/09/2022; I called Angie Larsen, clinical update given, discussed pc visit, medical goals, poc.    I spent 45 minutes providing this consultation. More than 50% of the time in this consultation was spent coordinating communication.    HISTORY OF PRESENT ILLNESS:  Angie Larsen is a 89 y.o. year old female with multiple medical problems including Alzheimer's dementia, dysphasia, hypertension, gerd, anxiety, depression.Angie Larsen continues to reside at skilled long-term care facility at at [facility]. She does remain total care with transfers, adl's bathing, dressing, turning, positions with incontinence bowel and bladder. Angie Larsen requires assistance to be fed with declined appetite slight weight loss with last weight 113.8 lbs. Angie Larsen has not had any recent hospitalizations, wounds, infections per staff. Angie. Larsen is severely cognitively impaired unable to verbalize her needs. At present Angie Larsen is lying in bed, appears comfortable. No visitors present. Angie Larsen does briefly make eye contact when spoken too. Angie Larsen does mumble word salad, unable to comprehend, no meaningful discussion with cognitive impairment. Angie Larsen was cooperative with assessment. Medical goals, medications, goc reviewed. Attempted to contact Angie Larsen for update on pc visit. Supportive role, will continue  to monitor disease progression, weights, appetite, supportive care with symptom management to minimize suffering   CODE STATUS: DNR PPS: 30% PAST MEDICAL HISTORY:      Past Medical History:  Diagnosis Date   Alzheimer's dementia ([place])     Anxiety     Depression     GERD (gastroesophageal reflux disease)      Hypertension      SOCIAL HX:  Social History    PHYSICAL EXAM:    General:  frail appearing, thin elderly, cognitively impaired female Cardiovascular: regular rate and rhythm Pulmonary: clear ant fields Abdomen: soft, nontender, + bowel sounds GU: no suprapubic tenderness Extremities: no edema, no joint deformities Skin: no rashes Neurological: Weakness but otherwise nonfocal/functionally quadriplegic  Thank you for the opportunity to participate in the care of Angie Larsen. Please call our office at [phone] if we can be of additional assistance.   Angie Larsen Angie Gully, NP

## 2022-04-05 ENCOUNTER — Encounter: Payer: Self-pay | Admitting: Nurse Practitioner

## 2022-04-05 ENCOUNTER — Non-Acute Institutional Stay: Payer: Medicare Other | Admitting: Nurse Practitioner

## 2022-04-05 DIAGNOSIS — R1310 Dysphagia, unspecified: Secondary | ICD-10-CM

## 2022-04-05 DIAGNOSIS — Z515 Encounter for palliative care: Secondary | ICD-10-CM

## 2022-04-05 DIAGNOSIS — G308 Other Alzheimer's disease: Secondary | ICD-10-CM

## 2022-04-05 DIAGNOSIS — R630 Anorexia: Secondary | ICD-10-CM

## 2022-04-05 NOTE — Progress Notes (Signed)
    [place] Consult Note Telephone: [phone]  Fax: [phone]    Date of encounter: 04/05/22 4:59 PM PATIENT NAME: Angie Larsen [address] [place] [address]   [phone] (home)  DOB: 1933/04/07 MRN: [id number] PRIMARY CARE PROVIDER:    [facility] [address] [place] [address] [phone] RESPONSIBLE PARTY:    Contact Information     Name Relation Home Work Mobile   [facility] Legal Guardian [phone]     Renaye Rakers [phone]        I met face to face with patient in [facility] facility. Palliative Care was asked to follow this patient by consultation request of  Brayton Mars, MD to address advance care planning and complex medical decision making. This is a follow up visit.   RECOMMENDATIONS and PLAN:  1. ACP: DNR 2. 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 Continue to monitor weights, appetite, aspiration precautions.  12/05/2021 weight 115 lbs 02/07/2022 weight 113.8 lbs 03/09/2022 wegiht 112.8 lbs 3. Alzheimer's Dementia. Continue with supportive measures as chronic disease remains Progressive. Minimize suffering, will revisit hospice when further declines Palliative care 1/8 / 2018 Palliative care 5 / 5 / 2014 to 5 / 7 / 2014 Palliative care 1 / 06/2016 to 2 / 1 / 2018 Hospice 05/26/2016 to 12 / 6 / 2018  4. Palliative care encounter; Palliative medicine team will continue to support patient, patient's family, and medical team. Visit consisted of counseling and education dealing with the complex and emotionally intense issues of symptom management and palliative care in the setting of serious and potentially life-threatening illness  I spent 35 minutes providing this consultation. More than 50% of the time in this consultation was spent coordinating communication.    HISTORY OF PRESENT ILLNESS:  Angie Larsen is a 89 y.o. year old female with multiple medical problems including  Alzheimer's dementia, dysphasia, hypertension, gerd, anxiety, depression.Angie Larsen continues to reside at skilled long-term care facility at at [facility]. She does remain total care with transfers, adl's bathing, dressing, turning, positions with incontinence bowel and bladder. Angie Larsen requires assistance to be fed. Purpose of today PC f/u visit further discussion monitor trends of appetite, weights, monitor for functional, cognitive decline with chronic disease progression, assess any active symptoms, supportive role. Angie. Larsen is severely cognitively impaired unable to verbalize her needs. At present Angie Larsen is lying in bed, appears comfortable. No visitors present. Angie Larsen does briefly make eye contact when spoken too. Angie Larsen does mumble word salad, unable to comprehend, no meaningful discussion with cognitive impairment. Angie Larsen was cooperative with assessment. Medical goals, medications, goc reviewed. Attempted to contact Angie Larsen for update on pc visit. PC f/u visit further discussion monitor trends of appetite, weights, monitor for functional, cognitive decline with chronic disease progression, assess any active symptoms, supportive role.   CODE STATUS: DNR PPS: 30% PHYSICAL EXAM:    General:  frail appearing, thin elderly, cognitively impaired female Cardiovascular: regular rate and rhythm Pulmonary: clear ant fields Abdomen: soft, nontender, + bowel sounds GU: no suprapubic tenderness Extremities: no edema, no joint deformities Skin: no rashes Neurological: Weakness but otherwise nonfocal/functionally quadriplegic Thank you for the opportunity to participate in the care of Angie. Larsen. Please call our office at [phone] if we can be of additional assistance.   Dreya Buhrman Ihor Gully, NP

## 2022-05-24 ENCOUNTER — Encounter: Payer: Self-pay | Admitting: Nurse Practitioner

## 2022-05-24 ENCOUNTER — Non-Acute Institutional Stay: Payer: Medicare Other | Admitting: Nurse Practitioner

## 2022-05-24 DIAGNOSIS — Z515 Encounter for palliative care: Secondary | ICD-10-CM

## 2022-05-24 DIAGNOSIS — R1310 Dysphagia, unspecified: Secondary | ICD-10-CM

## 2022-05-24 DIAGNOSIS — R630 Anorexia: Secondary | ICD-10-CM

## 2022-05-24 DIAGNOSIS — G308 Other Alzheimer's disease: Secondary | ICD-10-CM

## 2022-05-24 NOTE — Progress Notes (Signed)
    Therapist, nutritional Palliative Care Consult Note Telephone: [phone]  Fax: [phone]    Date of encounter: 05/24/22 3:41 PM PATIENT NAME: Angie Larsen [address] [place] [address]   [phone] (home)  DOB: [birth year] MRN: [id number] PRIMARY CARE PROVIDER:    [facility]  RESPONSIBLE PARTY:    Contact Information     Name Relation Home Work Mobile   Angie [facility] Legal Guardian [phone]     Angie Larsen [phone]        I met face to face with patient in [facility] facility. Palliative Care was asked to follow this patient by consultation request of  Dale Dell, MD to address advance care planning and complex medical decision making. This is a follow up visit.   RECOMMENDATIONS and PLAN:  1. ACP: DNR  Palliative care 1/8 / 2018 Palliative care 5 / 5 / 2014 to 5 / 7 / 2014 Palliative care 1 / 06/2016 to 2 / 1 / 2018 Hospice 05/26/2016 to 12 / 6 / 2018   2. 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 Continue to monitor weights, appetite, aspiration precautions.  12/05/2021 weight 115 lbs 02/07/2022 weight 113.8 lbs 03/09/2022 wegiht 112.8 lbs 05/08/2022 weight 95 lbs CORRECTED weight from 05/08/2022 111.8 lbs 3. Alzheimer's Dementia. Continue with supportive measures as chronic disease remains Progressive. Minimize suffering, will revisit hospice when further declines  4. Palliative care encounter; Palliative medicine team will continue to support patient, patient's family, and medical team. Visit consisted of counseling and education dealing with the complex and emotionally intense issues of symptom management and palliative care in the setting of serious and potentially life-threatening illness   I spent 45 minutes providing this consultation. More than 50% of the time in this consultation was spent coordinating communication.    HISTORY OF PRESENT ILLNESS:  Angie Larsen is a 89 y.o. year old female with multiple medical  problems including Alzheimer's dementia, dysphasia, hypertension, gerd, anxiety, depression.Angie Larsen continues to reside at skilled long-term care facility at at [facility]. She does remain total care with transfers, adl's bathing, dressing, turning, positions with incontinence bowel and bladder. Angie Larsen requires assistance to be fed. Purpose of today PC f/u visit further discussion monitor trends of appetite, weights, monitor for functional, cognitive decline with chronic disease progression, assess any active symptoms, supportive role. Angie. Larsen is severely cognitively impaired unable to verbalize her needs. At present Angie Larsen is lying in bed, appears comfortable. No visitors present.   PC f/u visit further discussion monitor trends of appetite, weights, monitor for functional, cognitive decline with chronic disease progression, assess any active symptoms, supportive role.   CODE STATUS: DNR PPS: 30% PHYSICAL EXAM:    General:  frail appearing, thin elderly, cognitively impaired female Cardiovascular: regular rate and rhythm Pulmonary: clear ant fields Extremities: +muscle wasting Skin: no rashes Neurological: Weakness but otherwise nonfocal/functionally quadriplegic Thank you for the opportunity to participate in the care of Angie Larsen. Please call our office at [phone] if we can be of additional assistance.   Angie Larsen Prince Rome, NP

## 2022-07-12 ENCOUNTER — Encounter: Payer: Self-pay | Admitting: Nurse Practitioner

## 2022-07-12 ENCOUNTER — Non-Acute Institutional Stay: Payer: Medicare Other | Admitting: Nurse Practitioner

## 2022-07-12 DIAGNOSIS — R630 Anorexia: Secondary | ICD-10-CM

## 2022-07-12 DIAGNOSIS — Z515 Encounter for palliative care: Secondary | ICD-10-CM

## 2022-07-12 DIAGNOSIS — G308 Other Alzheimer's disease: Secondary | ICD-10-CM

## 2022-07-12 DIAGNOSIS — R1310 Dysphagia, unspecified: Secondary | ICD-10-CM

## 2022-07-12 NOTE — Progress Notes (Signed)
    Therapist, nutritional Palliative Care Consult Note Telephone: [phone]  Fax: [phone]    Date of encounter: 07/12/22 9:11 PM PATIENT NAME: Angie Larsen [address] [place] [address]   [phone] (home)  DOB: [birth year] MRN: [id number] PRIMARY CARE PROVIDER:    [facility]  RESPONSIBLE PARTY:    Contact Information     Name Relation Home Work Mobile   [facility] Legal Guardian [phone]     Angie Larsen [phone]        I met face to face with patient in [facility] facility. Palliative Care was asked to follow this patient by consultation request of  Angie [place], MD to address advance care planning and complex medical decision making. This is a follow up visit.   RECOMMENDATIONS and PLAN:  1. ACP: DNR   Palliative care 1/8 / 2018 Palliative care 5 / 5 / 2014 to 5 / 7 / 2014 Palliative care 1 / 06/2016 to 2 / 1 / 2018 Hospice 05/26/2016 to 12 / 6 / 2018    2. 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 Continue to monitor weights, appetite, aspiration precautions.  12/05/2021 weight 115 lbs 02/07/2022 weight 113.8 lbs 03/09/2022 wegiht 112.8 lbs 05/08/2022 weight 95 lbs CORRECTED weight from 05/08/2022 111.8 lbs 06/07/2022 weight 112.3 lbs 3. Alzheimer's Dementia. Continue with supportive measures as chronic disease remains Progressive. Minimize suffering, will revisit hospice when further declines   4. Palliative care encounter; Palliative medicine team will continue to support patient, patient's family, and medical team. Visit consisted of counseling and education dealing with the complex and emotionally intense issues of symptom management and palliative care in the setting of serious and potentially life-threatening illness   I spent 46 minutes providing this consultation. More than 50% of the time in this consultation was spent coordinating communication.    HISTORY OF PRESENT ILLNESS:  Angie Larsen is a 89 y.o. year old  female with multiple medical problems including Alzheimer's dementia, dysphasia, hypertension, gerd, anxiety, depression.Angie Larsen continues to reside at skilled long-term care facility at at [facility]. She does remain total care with transfers, adl's bathing, dressing, turning, positions with incontinence bowel and bladder. Angie Larsen requires assistance to be fed. Purpose of today PC f/u visit further discussion monitor trends of appetite, weights, monitor for functional, cognitive decline with chronic disease progression, assess any active symptoms, supportive role. Angie. Larsen is severely cognitively impaired unable to verbalize her needs. At present Angie Larsen is lying in bed, appears comfortable. No visitors present. Angie Larsen did make eye contact, mumbling word salad. Reviewed weights with weight gain, discussed with staff, no new cognitive or functional changes of further decline. Medications, poc, goc, notes reviewed. No meaningful discussion with cognitive impairment. Support provided, currently stable. PC f/u visit further discussion monitor trends of appetite, weights, monitor for functional, cognitive decline with chronic disease progression, assess any active symptoms, supportive role. Attempted to contact Angie Larsen, updated staff.   CODE STATUS: DNR PPS: 30% PHYSICAL EXAM:    General:  frail appearing, thin elderly, cognitively impaired female Cardiovascular: regular rate and rhythm Pulmonary: clear ant fields Extremities: +muscle wasting Skin: no rashes Neurological: Weakness but otherwise nonfocal/functionally quadriplegic Thank you for the opportunity to participate in the care of Angie Larsen. Please call our office at [phone] if we can be of additional assistance.   Gleb Mcguire Prince Rome, NP
